# Patient Record
Sex: Male | Born: 1970 | Race: White | Hispanic: No | Marital: Married | Smoking: Former smoker
Health system: Southern US, Community
[De-identification: ages and names within clinical notes are randomized; demographics above are authoritative.]

## PROBLEM LIST (undated history)

## (undated) HISTORY — PX: SPINE SURGERY: SHX786

## (undated) HISTORY — PX: VASECTOMY: SHX75

## (undated) HISTORY — PX: KNEE SURGERY: SHX244

---

## 2000-09-09 ENCOUNTER — Other Ambulatory Visit: Admission: RE | Admit: 2000-09-09 | Discharge: 2000-09-09 | Payer: Self-pay | Admitting: Orthopedic Surgery

## 2003-10-19 ENCOUNTER — Encounter: Admission: RE | Admit: 2003-10-19 | Discharge: 2004-01-17 | Payer: Self-pay | Admitting: Nurse Practitioner

## 2004-05-15 ENCOUNTER — Ambulatory Visit: Payer: Self-pay | Admitting: Family Medicine

## 2004-06-04 HISTORY — PX: VASECTOMY: SHX75

## 2005-04-27 ENCOUNTER — Ambulatory Visit: Payer: Self-pay | Admitting: Family Medicine

## 2006-09-23 ENCOUNTER — Encounter: Payer: Self-pay | Admitting: Family Medicine

## 2006-09-23 ENCOUNTER — Ambulatory Visit: Payer: Self-pay | Admitting: Family Medicine

## 2008-06-04 HISTORY — PX: SPINE SURGERY: SHX786

## 2010-07-05 ENCOUNTER — Encounter: Payer: Self-pay | Admitting: Family Medicine

## 2010-07-05 ENCOUNTER — Ambulatory Visit: Admit: 2010-07-05 | Payer: Self-pay | Admitting: Family Medicine

## 2010-07-05 ENCOUNTER — Ambulatory Visit (INDEPENDENT_AMBULATORY_CARE_PROVIDER_SITE_OTHER): Payer: 59 | Admitting: Family Medicine

## 2010-07-05 ENCOUNTER — Ambulatory Visit: Payer: Self-pay | Admitting: Family Medicine

## 2010-07-05 DIAGNOSIS — N529 Male erectile dysfunction, unspecified: Secondary | ICD-10-CM | POA: Insufficient documentation

## 2010-07-05 DIAGNOSIS — M549 Dorsalgia, unspecified: Secondary | ICD-10-CM | POA: Insufficient documentation

## 2010-07-05 DIAGNOSIS — Z0000 Encounter for general adult medical examination without abnormal findings: Secondary | ICD-10-CM

## 2010-07-05 DIAGNOSIS — Z23 Encounter for immunization: Secondary | ICD-10-CM

## 2010-07-05 DIAGNOSIS — M25569 Pain in unspecified knee: Secondary | ICD-10-CM | POA: Insufficient documentation

## 2010-07-05 DIAGNOSIS — M5412 Radiculopathy, cervical region: Secondary | ICD-10-CM | POA: Insufficient documentation

## 2010-07-12 NOTE — Assessment & Plan Note (Addendum)
Summary: new pt to est/jrt   Vital Signs:  Patient profile:   40 year old male Height:      71 inches Weight:      196.50 pounds BMI:     27.51 Temp:     98.3 degrees F oral Pulse rate:   84 / minute Pulse rhythm:   regular BP sitting:   116 / 82  (left arm) Cuff size:   regular  Vitals Entered By: Delilah Shan CMA Rumaysa Sabatino Dull) (July 05, 2010 3:15 PM) CC: New Patient to Establish   History of Present Illness: New patient- here to reest care.   CPE- see plan.   Knee pain- R knee with pain. Prev seen by Dr. August Saucer and had arthroscopy done before.  H/o ganglion cyst removal from the knee.  He has limited range of motion for knee flexion.  Crepitus noted by patient.   R lower back pain- prev ortho eval.  No recent changes.  Continues to have episodic pain.   h/o pars defect per patient.   R arm symptoms.  R thumb has a decrease in sensaiton.  If he abducts is arm above his head, he gets pain, numbness in the arm.  He feels a "pulling" in the neck on the R side with head tilt to the left.  Going on for a few weeks.  Can do push ups with neck extended but R arm will go numb in dermatomal distribtion if he does push ups with spine in neutral position.   ED- Some ED with decrease in AM erections.  Gradual change per patient. Nonsmoker. He has some dribbling with stopping urine stream.  No problems starting urination.   Allergies (verified): No Known Drug Allergies  Past History:  Past Medical History: h/o varicella FAMILY HISTORY OF DIABETES MELLITUS (ICD-V18.0) PHYSICAL EXAMINATION (ICD-V70.0) ORGANIC IMPOTENCE (ICD-607.84) CERVICAL RADICULOPATHY, RIGHT (ICD-723.4) BACK PAIN (ICD-724.5), h/o pars defect per ortho KNEE PAIN, RIGHT (EAV-409.81)    Past Surgical History: Tonsillectomy as child Vasectomy  Family History: Reviewed history from 09/23/2006 and no changes required. Father A, arthritis Mother A arthritis Brother A healthy  Social History: Reviewed history from  09/23/2006 and no changes required. Occupation:Machine operator [employer] Married 1996, 2 Daughters Former Smoker Quit 1997 20pyh Alcohol use-yes, occ Drug use-no exercise: decreased after knee pain increase, still walking some  Review of Systems       See HPI.  Otherwise negative.    Physical Exam  General:  no apparent distress normocephalic atraumatic mucous membranes moist neck supple but spurling's positive for T1 dermatome on the R no midline pain, but spinous process of T1 is deviated to R by a few mm.  this isn't tender to palpation regular rate and rhythm clear to auscultation bilaterally abdomen soft, not tender to palpation ext well perfused.  R knee with scar noted but not puffy.  ACL/MCL/LCL intact on testing but patellar crepitus noted. SLR neg.  DTRs wnl no weakness in the arms, hands.  he does have mild increase in R thumb numbness with persistent wrist flexion.   Genitalia:  Testes bilaterally descended without nodularity, tenderness or masses. No scrotal masses or lesions. No penis lesions or urethral discharge. Prostate:  Prostate gland firm and smooth, no enlargement, nodularity, tenderness, mass, asymmetry or induration.   Impression & Recommendations:  Problem # 1:  Preventive Health Care (ICD-V70.0) Tdap today.  Flu shot encouraged.  No indication for PSA, colon CA screening.  Hope to  get leg and back pain controlled so he can start back with exercise routine.   Problem # 2:  ORGANIC IMPOTENCE (ICD-607.84) Check testosterone level.  If low, will refer to uro for consideration of tx.  If not low, then can consider viagra or other.  See notes on labs.  Return for AM sample.  d/w patient and he understood.   Problem # 3:  CERVICAL RADICULOPATHY, RIGHT (ICD-723.4) with T1 dermatome distribution along with L thumb numbness.  He may have radicular source and carpal tunnel.  I offered medical tx, physical therapy, neurosurg referral. He wanted to  try chiropracter and I think this is reasonable for now as he has no weakness.  If not improved, he'll call back with update and we can refer.    Problem # 4:  KNEE PAIN, RIGHT (ICD-719.46) He'll follow up with ortho.   Problem # 5:  BACK PAIN (ICD-724.5) He'll follow up with ortho.   Problem # 6:  FAMILY HISTORY OF DIABETES MELLITUS (ICD-V18.0) Return for fasting labs.   Complete Medication List: 1)  Multivitamins Tabs (Multiple vitamin) .... Take 1 tablet by mouth once a day  Other Orders: Tdap => 26yrs IM (04540) Admin 1st Vaccine (98119)  Patient Instructions: 1)  Come back for fasting labs one morning.  cmet/lipid v18.0, testosterone 607.84.  2)  I would call Dr. August Saucer about your knee and lower back pain.  3)  If you take the aleve at night for your arm, make sure to eat before taking the medicine. 4)  I would either start physical therapy or go to a chiropracter about your arm symptoms.  If you aren't getting better, let me know so we can talk about options (like neurosurgery referral).  If have weakness or progressive symptoms, you need to let me know.  5)  I would get a flu shot.  6)  Take care.   Orders Added: 1)  New Patient 40-64 years [99386] 2)  New Patient Level II [99202] 3)  Tdap => 33yrs IM [90715] 4)  Admin 1st Vaccine [90471]   Immunizations Administered:  Tetanus Vaccine:    Vaccine Type: Tdap    Site: left deltoid    Mfr: GlaxoSmithKline    Dose: 0.5 ml    Route: IM    Given by: Delilah Shan CMA (AAMA)    Exp. Date: 03/23/2012    Lot #: [id number]    VIS given: 04/21/08 version given July 05, 2010.   Immunizations Administered:  Tetanus Vaccine:    Vaccine Type: Tdap    Site: left deltoid    Mfr: GlaxoSmithKline    Dose: 0.5 ml    Route: IM    Given by: Delilah Shan CMA (AAMA)    Exp. Date: 03/23/2012    Lot #: [id number]    VIS given: 04/21/08 version given July 05, 2010.  Current Allergies (reviewed today): No known  allergies    Appended Document: new pt to est/jrt CPE- healthy habits encouraged.

## 2010-07-17 ENCOUNTER — Encounter: Payer: Self-pay | Admitting: Family Medicine

## 2010-07-27 ENCOUNTER — Other Ambulatory Visit: Payer: Self-pay | Admitting: Family Medicine

## 2010-07-27 ENCOUNTER — Encounter (INDEPENDENT_AMBULATORY_CARE_PROVIDER_SITE_OTHER): Payer: Self-pay | Admitting: *Deleted

## 2010-07-27 ENCOUNTER — Other Ambulatory Visit (INDEPENDENT_AMBULATORY_CARE_PROVIDER_SITE_OTHER): Payer: 59

## 2010-07-27 DIAGNOSIS — N529 Male erectile dysfunction, unspecified: Secondary | ICD-10-CM

## 2010-07-27 DIAGNOSIS — Z833 Family history of diabetes mellitus: Secondary | ICD-10-CM

## 2010-07-27 LAB — BASIC METABOLIC PANEL
BUN: 20 mg/dL (ref 6–23)
Chloride: 109 mEq/L (ref 96–112)
Creatinine, Ser: 0.9 mg/dL (ref 0.4–1.5)
Glucose, Bld: 83 mg/dL (ref 70–99)
Potassium: 5.2 mEq/L — ABNORMAL HIGH (ref 3.5–5.1)

## 2010-07-27 LAB — LIPID PANEL
Cholesterol: 164 mg/dL (ref 0–200)
LDL Cholesterol: 106 mg/dL — ABNORMAL HIGH (ref 0–99)
Triglycerides: 54 mg/dL (ref 0.0–149.0)

## 2010-07-27 LAB — HEPATIC FUNCTION PANEL
ALT: 33 U/L (ref 0–53)
Albumin: 4 g/dL (ref 3.5–5.2)
Total Bilirubin: 0.8 mg/dL (ref 0.3–1.2)
Total Protein: 6.3 g/dL (ref 6.0–8.3)

## 2010-07-31 ENCOUNTER — Encounter (INDEPENDENT_AMBULATORY_CARE_PROVIDER_SITE_OTHER): Payer: Self-pay | Admitting: *Deleted

## 2010-07-31 ENCOUNTER — Other Ambulatory Visit: Payer: Self-pay | Admitting: Family Medicine

## 2010-07-31 ENCOUNTER — Other Ambulatory Visit (INDEPENDENT_AMBULATORY_CARE_PROVIDER_SITE_OTHER): Payer: 59

## 2010-07-31 DIAGNOSIS — E875 Hyperkalemia: Secondary | ICD-10-CM

## 2010-08-10 ENCOUNTER — Encounter: Payer: Self-pay | Admitting: Family Medicine

## 2010-08-10 NOTE — Letter (Signed)
Summary: [facility]  [facility]   Imported By: Kassie Mends 08/02/2010 08:20:25  _____________________________________________________________________  External Attachment:    Type:   Image     Comment:   External Document  Appended Document: [facility]    Clinical Lists Changes  Observations: Added new observation of PAST MED HX: h/o varicella FAMILY HISTORY OF DIABETES MELLITUS (ICD-V18.0) PHYSICAL EXAMINATION (ICD-V70.0) ORGANIC IMPOTENCE (ICD-607.84) CERVICAL RADICULOPATHY, RIGHT (ICD-723.4) per Dr. Danielle Dess 2012 BACK PAIN (ICD-724.5), h/o pars defect per ortho KNEE PAIN, RIGHT (ZOX-096.04)   (08/02/2010 21:22)       Past History:  Past Medical History: h/o varicella FAMILY HISTORY OF DIABETES MELLITUS (ICD-V18.0) PHYSICAL EXAMINATION (ICD-V70.0) ORGANIC IMPOTENCE (ICD-607.84) CERVICAL RADICULOPATHY, RIGHT (ICD-723.4) per Dr. Danielle Dess 2012 BACK PAIN (ICD-724.5), h/o pars defect per ortho KNEE PAIN, RIGHT (ICD-719.46)

## 2010-08-28 ENCOUNTER — Encounter: Payer: Self-pay | Admitting: Family Medicine

## 2010-08-31 NOTE — Letter (Signed)
Summary: [facility]  [facility]   Imported By: Maryln Gottron 08/18/2010 14:41:51  _____________________________________________________________________  External Attachment:    Type:   Image     Comment:   External Document  Appended Document: [facility]    Clinical Lists Changes  Allergies: Added new allergy or adverse reaction of * MOBIC Observations: Added new observation of ALLERGY REV: Done (08/20/2010 11:37) Added new observation of NKA: F (08/20/2010 11:37)        Allergies (verified): 1)  ! * Mobic

## 2010-09-24 ENCOUNTER — Encounter: Payer: Self-pay | Admitting: Family Medicine

## 2012-12-23 ENCOUNTER — Ambulatory Visit (INDEPENDENT_AMBULATORY_CARE_PROVIDER_SITE_OTHER): Payer: BC Managed Care – PPO | Admitting: Family Medicine

## 2012-12-23 ENCOUNTER — Encounter: Payer: Self-pay | Admitting: Family Medicine

## 2012-12-23 VITALS — BP 120/72 | HR 75 | Temp 97.7°F | Ht 71.0 in | Wt 201.0 lb

## 2012-12-23 DIAGNOSIS — R079 Chest pain, unspecified: Secondary | ICD-10-CM | POA: Insufficient documentation

## 2012-12-23 DIAGNOSIS — M654 Radial styloid tenosynovitis [de Quervain]: Secondary | ICD-10-CM

## 2012-12-23 MED ORDER — DICLOFENAC SODIUM 75 MG PO TBEC: 75.0000 mg | DELAYED_RELEASE_TABLET | Freq: Two times a day (BID) | ORAL | Status: DC

## 2012-12-23 NOTE — Assessment & Plan Note (Signed)
Treat with NSAIDs, stretches, info given.  Wear thumb spica splint daily.

## 2012-12-23 NOTE — Progress Notes (Signed)
  Subjective:    Patient ID: Timothy Zavala, male    DOB: [birth year], 42 y.o.   MRN: [id number]  HPI  42 year old male pt of Dr. Lianne Bushy with history of right cervical radiculopathy presents with  24 hours of pain with left wrist pain pulling thumb up, opening hand. Has pain in mid lower arm. Pain was worsening with work yesterday moving. He does do a lot of repetitive twisting. No typing. No swelling, no redness, no heat.  No numbness or tingling in left hand.   He had artificial disk in neck for degenerative changes in cervical spine placed2012  No change in chronic neck pain.. Just tightness in neck.   In last few months he has had some minutes of left chest wall twinges of pain during exercise, goes away with rest.  He feels SOB from being out of shape all the time, no sweats. Has been stressed out lately.  No family history of CAD.  remote smoker...8 pack year history.    Review of Systems  Constitutional: Negative for fever and fatigue.  HENT: Negative for ear discharge.   Eyes: Negative for pain.  Respiratory: Negative for cough, shortness of breath and wheezing.   Cardiovascular: Positive for chest pain and palpitations. Negative for leg swelling.  Gastrointestinal: Negative for abdominal pain and abdominal distention.       Objective:   Physical Exam  Constitutional: Vital signs are normal. He appears well-developed and well-nourished.  Muscular male  HENT:  Head: Normocephalic.  Right Ear: Hearing normal.  Left Ear: Hearing normal.  Nose: Nose normal.  Mouth/Throat: Oropharynx is clear and moist and mucous membranes are normal.  Neck: Trachea normal. Carotid bruit is not present. No mass and no thyromegaly present.  Cardiovascular: Normal rate, regular rhythm and normal pulses.  Exam reveals no gallop, no distant heart sounds and no friction rub.   No murmur heard. No peripheral edema  Pulmonary/Chest: Effort normal and breath sounds normal. No  respiratory distress.  Musculoskeletal:       Left wrist: He exhibits decreased range of motion and tenderness. He exhibits no bony tenderness, no swelling, no effusion, no crepitus and no deformity.  Positive finkelstein test on left.  Ttp over thumb abductors. Nml grip strength Neg tinel, neg phalen's  Skin: Skin is warm, dry and intact. No rash noted.  Psychiatric: He has a normal mood and affect. His speech is normal and behavior is normal. Thought content normal.          Assessment & Plan:

## 2012-12-23 NOTE — Assessment & Plan Note (Addendum)
EKG showed NSR. If pain continuing.. Follow up with Dr. Para March for further eval such as cardiolyte, but pt is low risk for CAD.

## 2012-12-23 NOTE — Patient Instructions (Addendum)
Start home stretching exercises.  Avoid twisting repetitively.   Wear brace on left wrist.  Start diclofenac 75 mg  twice daily  as needed for pain and inflammation.  Call if not improving in 2 weeks.

## 2017-03-14 ENCOUNTER — Ambulatory Visit: Payer: Self-pay | Admitting: Family Medicine

## 2017-03-14 ENCOUNTER — Encounter: Payer: Self-pay | Admitting: Family Medicine

## 2017-03-14 ENCOUNTER — Ambulatory Visit (INDEPENDENT_AMBULATORY_CARE_PROVIDER_SITE_OTHER): Payer: BLUE CROSS/BLUE SHIELD | Admitting: Family Medicine

## 2017-03-14 VITALS — BP 110/80 | HR 64 | Temp 98.0°F | Ht 71.25 in | Wt 220.4 lb

## 2017-03-14 DIAGNOSIS — F1010 Alcohol abuse, uncomplicated: Secondary | ICD-10-CM

## 2017-03-14 DIAGNOSIS — G8929 Other chronic pain: Secondary | ICD-10-CM | POA: Diagnosis not present

## 2017-03-14 DIAGNOSIS — M549 Dorsalgia, unspecified: Secondary | ICD-10-CM | POA: Diagnosis not present

## 2017-03-14 DIAGNOSIS — K429 Umbilical hernia without obstruction or gangrene: Secondary | ICD-10-CM | POA: Diagnosis not present

## 2017-03-14 DIAGNOSIS — M25569 Pain in unspecified knee: Secondary | ICD-10-CM

## 2017-03-14 NOTE — Patient Instructions (Signed)
BEFORE YOU LEAVE: -assist him in scheduling OMT if he wishes -follow up: yearly for physical  -We placed a referral for you as discussed to surgery about the hernia. It usually takes about 1-2 weeks to process and schedule this referral. If you have not heard from [place] regarding this appointment in 2 weeks please contact our office.   We recommend the following healthy lifestyle for LIFE: 1) Small portions. Regular meals.  2) Eat a healthy clean diet.   TRY TO EAT: -at least 5-7 servings of low sugar vegetables per day (not corn, potatoes or bananas.) -berries are the best choice if you wish to eat fruit.   -lean meets (fish, chicken or [place] breasts) -vegan proteins for some meals - beans or tofu, whole grains, nuts and seeds -Replace bad fats with good fats - good fats include: fish, nuts and seeds, canola oil, olive oil -small amounts of low fat or non fat dairy -small amounts of100 % whole grains - check the lables  AVOID: -SUGAR, sweets, anything with added sugar, corn syrup or sweeteners -if you must have a sweetener, small amounts of stevia may be best -sweetened beverages -simple starches (rice, bread, potatoes, pasta, chips, etc - small amounts of 100% whole grains are ok) -red meat, pork, butter -fried foods, fast food, processed food, excessive dairy, eggs and coconut.  3)Get at least 150 minutes of sweaty aerobic exercise per week.  4)Reduce stress - consider counseling, meditation and relaxation to balance other aspects of your life.

## 2017-03-14 NOTE — Progress Notes (Signed)
HPI:  Timothy Zavala is here to establish care. His main concern today is an umbilical hernia. He likes to do a lot of heavy lifting and stay active and this is interfering with his ability to do so. He has noticed for some time a bulge around the umbilicus. Sometimes it is uncomfortable. It is reducible. He has past medical history of chronic knee pain and back pain. He has had disc replacement with Dr. Danielle Dess. He also has had arthroscopy of the knee with orthopedics in the past. He is interested in osteopathic treatments. He drinks a significant amount of alcohol. About a 6 pack per day. He does not feel that he would have any difficulty cutting back.  ROS negative for unless reported above: fevers, unintentional weight loss, hearing or vision loss, chest pain, palpitations, struggling to breath, hemoptysis, melena, hematochezia, hematuria, falls, loc, si, thoughts of self harm  No past medical history on file.  Past Surgical History:  Procedure Laterality Date  . SPINE SURGERY    . VASECTOMY      Family History  Problem Relation Age of Onset  . Arthritis Mother   . Hypotension Mother   . Arthritis Father   . Stomach cancer Paternal Uncle   . Atrial fibrillation Paternal Uncle     Social History   Social History  . Marital status: Married    Spouse name: N/A  . Number of children: N/A  . Years of education: N/A   Social History Main Topics  . Smoking status: Former Games developer  . Smokeless tobacco: Never Used  . Alcohol use Yes  . Drug use: No  . Sexual activity: Not Asked   Other Topics Concern  . None   Social History Narrative   Work or Chief Strategy Officer      Home Situation:      Spiritual Beliefs:      Lifestyle:       No current outpatient prescriptions on file.  EXAM:  Vitals:   03/14/17 1426  BP: 110/80  Pulse: 64  Temp: 98 F (36.7 C)  SpO2: 97%    Body mass index is 30.52 kg/m.  GENERAL: vitals reviewed and listed above, alert,  oriented, appears well hydrated and in no acute distress  HEENT: atraumatic, conjunttiva clear, no obvious abnormalities on inspection of external nose and ears  NECK: no obvious masses on inspection  LUNGS: clear to auscultation bilaterally, no wheezes, rales or rhonchi, good air movement  CV: HRRR, no peripheral edema  ABD: small reducible umbilical hernia  MS: moves all extremities without noticeable abnormality  PSYCH: pleasant and cooperative, no obvious depression or anxiety  ASSESSMENT AND PLAN:  Discussed the following assessment and plan:  Umbilical hernia without obstruction and without gangrene - Plan: Ambulatory referral to General Surgery  Chronic back pain, unspecified back location, unspecified back pain laterality  Chronic knee pain, unspecified laterality  Alcohol abuse -We reviewed the PMH, PSH, FH, SH, Meds and Allergies. -advised to cut back on alcohol use to no more then 3 drinks in a day and no more then 14 in a week tops -advised and healthy diet and regular gentle aerobic exercise -referral to surgery to discuss repair options for small umbilical hernia, risks and emergency precautions discussed -he wants to consider OMT and may schedule -advised lipids, hgba1c and LFTs - he reports does at work yearly and will bring copy, reports all was good -We have advised patient to follow up per instructions below.   -  Patient advised to return or notify a doctor immediately if symptoms worsen or persist or new concerns arise.  Patient Instructions  BEFORE YOU LEAVE: -assist him in scheduling OMT if he wishes -follow up: yearly for physical  -We placed a referral for you as discussed to surgery about the hernia. It usually takes about 1-2 weeks to process and schedule this referral. If you have not heard from [place] regarding this appointment in 2 weeks please contact our office.   We recommend the following healthy lifestyle for LIFE: 1) Small portions. Regular  meals.  2) Eat a healthy clean diet.   TRY TO EAT: -at least 5-7 servings of low sugar vegetables per day (not corn, potatoes or bananas.) -berries are the best choice if you wish to eat fruit.   -lean meets (fish, chicken or [place] breasts) -vegan proteins for some meals - beans or tofu, whole grains, nuts and seeds -Replace bad fats with good fats - good fats include: fish, nuts and seeds, canola oil, olive oil -small amounts of low fat or non fat dairy -small amounts of100 % whole grains - check the lables  AVOID: -SUGAR, sweets, anything with added sugar, corn syrup or sweeteners -if you must have a sweetener, small amounts of stevia may be best -sweetened beverages -simple starches (rice, bread, potatoes, pasta, chips, etc - small amounts of 100% whole grains are ok) -red meat, pork, butter -fried foods, fast food, processed food, excessive dairy, eggs and coconut.  3)Get at least 150 minutes of sweaty aerobic exercise per week.  4)Reduce stress - consider counseling, meditation and relaxation to balance other aspects of your life.     Kriste Basque R.

## 2017-03-19 ENCOUNTER — Ambulatory Visit: Payer: Self-pay | Admitting: General Surgery

## 2017-03-19 DIAGNOSIS — K420 Umbilical hernia with obstruction, without gangrene: Secondary | ICD-10-CM | POA: Diagnosis not present

## 2017-03-26 ENCOUNTER — Encounter: Payer: Self-pay | Admitting: Family Medicine

## 2017-03-26 ENCOUNTER — Ambulatory Visit (INDEPENDENT_AMBULATORY_CARE_PROVIDER_SITE_OTHER): Payer: BLUE CROSS/BLUE SHIELD | Admitting: Family Medicine

## 2017-03-26 VITALS — BP 102/80 | HR 66 | Temp 98.0°F | Ht 71.25 in | Wt 218.6 lb

## 2017-03-26 DIAGNOSIS — M9902 Segmental and somatic dysfunction of thoracic region: Secondary | ICD-10-CM | POA: Diagnosis not present

## 2017-03-26 DIAGNOSIS — M9904 Segmental and somatic dysfunction of sacral region: Secondary | ICD-10-CM | POA: Diagnosis not present

## 2017-03-26 DIAGNOSIS — M9903 Segmental and somatic dysfunction of lumbar region: Secondary | ICD-10-CM

## 2017-03-26 DIAGNOSIS — G8929 Other chronic pain: Secondary | ICD-10-CM

## 2017-03-26 DIAGNOSIS — K429 Umbilical hernia without obstruction or gangrene: Secondary | ICD-10-CM

## 2017-03-26 DIAGNOSIS — M545 Low back pain, unspecified: Secondary | ICD-10-CM

## 2017-03-26 DIAGNOSIS — M9901 Segmental and somatic dysfunction of cervical region: Secondary | ICD-10-CM

## 2017-03-26 DIAGNOSIS — M542 Cervicalgia: Secondary | ICD-10-CM | POA: Diagnosis not present
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
## 2017-03-26 NOTE — Patient Instructions (Addendum)
BEFORE YOU LEAVE: -SI joint exercises -follow up: 1-2 weeks if needed  Also check out [employer]"Foundation Training" which is a program developed by Dr. Myles LippsEric Goodman. Do not do any of the moves that aggravate or cause your hernia to "pop" out.   A good intro video is: "Independence from Pain 7-minute Video" - [email or web address]/[email or web address]?[email or web address]

## 2017-08-01 ENCOUNTER — Encounter: Payer: BLUE CROSS/BLUE SHIELD | Admitting: Family Medicine

## 2017-08-15 ENCOUNTER — Ambulatory Visit (INDEPENDENT_AMBULATORY_CARE_PROVIDER_SITE_OTHER): Payer: BLUE CROSS/BLUE SHIELD | Admitting: Family Medicine

## 2017-08-15 ENCOUNTER — Encounter: Payer: Self-pay | Admitting: Family Medicine

## 2017-08-15 VITALS — BP 112/78 | HR 98 | Temp 97.6°F | Ht 71.25 in | Wt 215.9 lb

## 2017-08-15 DIAGNOSIS — Z0000 Encounter for general adult medical examination without abnormal findings: Secondary | ICD-10-CM | POA: Diagnosis not present

## 2017-08-15 DIAGNOSIS — Z1331 Encounter for screening for depression: Secondary | ICD-10-CM | POA: Diagnosis not present

## 2017-08-15 NOTE — Patient Instructions (Signed)
BEFORE YOU LEAVE: -labs -follow up: yearly for physical and as needed  We have ordered labs or studies at this visit. It can take up to 1-2 weeks for results and processing. IF results require follow up or explanation, we will call you with instructions. Clinically stable results will be released to your [facility]. If you have not heard from [place] or cannot find your results in [facility] in 2 weeks please contact our office at [phone].  If you are not yet signed up for [facility], please consider signing up.        Preventive Care 40-64 Years, Male Preventive care refers to lifestyle choices and visits with your health care provider that can promote health and wellness. What does preventive care include?  A yearly physical exam. This is also called an annual well check.  Dental exams once or twice a year.  Routine eye exams. Ask your health care provider how often you should have your eyes checked.  Personal lifestyle choices, including: ? Daily care of your teeth and gums. ? Regular physical activity. ? Eating a healthy diet. ? Avoiding tobacco and drug use. ? Limiting alcohol use. ? Practicing safe sex. ? Taking low-dose aspirin every day starting at age 69. What happens during an annual well check? The services and screenings done by your health care provider during your annual well check will depend on your age, overall health, lifestyle risk factors, and family history of disease. Counseling Your health care provider may ask you questions about your:  Alcohol use.  Tobacco use.  Drug use.  Emotional well-being.  Home and relationship well-being.  Sexual activity.  Eating habits.  Work and work Statistician.  Screening You may have the following tests or measurements:  Height, weight, and BMI.  Blood pressure.  Lipid and cholesterol levels. These may be checked every 5 years, or more frequently if you are over 75 years old.  Skin check.  Lung cancer  screening. You may have this screening every year starting at age 5 if you have a 30-pack-year history of smoking and currently smoke or have quit within the past 15 years.  Fecal occult blood test (FOBT) of the stool. You may have this test every year starting at age 44.  Flexible sigmoidoscopy or colonoscopy. You may have a sigmoidoscopy every 5 years or a colonoscopy every 10 years starting at age 72.  Prostate cancer screening. Recommendations will vary depending on your family history and other risks.  Hepatitis C blood test.  Hepatitis B blood test.  Sexually transmitted disease (STD) testing.  Diabetes screening. This is done by checking your blood sugar (glucose) after you have not eaten for a while (fasting). You may have this done every 1-3 years.  Discuss your test results, treatment options, and if necessary, the need for more tests with your health care provider. Vaccines Your health care provider may recommend certain vaccines, such as:  Influenza vaccine. This is recommended every year.  Tetanus, diphtheria, and acellular pertussis (Tdap, Td) vaccine. You may need a Td booster every 10 years.  Varicella vaccine. You may need this if you have not been vaccinated.  Zoster vaccine. You may need this after age 28.  Measles, mumps, and rubella (MMR) vaccine. You may need at least one dose of MMR if you were born in 1957 or later. You may also need a second dose.  Pneumococcal 13-valent conjugate (PCV13) vaccine. You may need this if you have certain conditions and have not been vaccinated.  Pneumococcal polysaccharide (PPSV23) vaccine. You may need one or two doses if you smoke cigarettes or if you have certain conditions.  Meningococcal vaccine. You may need this if you have certain conditions.  Hepatitis A vaccine. You may need this if you have certain conditions or if you travel or work in places where you may be exposed to hepatitis A.  Hepatitis B vaccine. You  may need this if you have certain conditions or if you travel or work in places where you may be exposed to hepatitis B.  Haemophilus influenzae type b (Hib) vaccine. You may need this if you have certain risk factors.  Talk to your health care provider about which screenings and vaccines you need and how often you need them. This information is not intended to replace advice given to you by your health care provider. Make sure you discuss any questions you have with your health care provider. Document Released: 06/17/2015 Document Revised: 02/08/2016 Document Reviewed: 03/22/2015 Elsevier Interactive Patient Education  [employer].

## 2017-08-15 NOTE — Progress Notes (Signed)
HPI:  Using dictation device. Unfortunately this device frequently misinterprets words/phrases.  Here for CPE: PMH chronic neck pain, abd hernia.  Reports is been doing well.  Neck is been stable and.  He is never did see the surgeon yet about the hernia, but has not had any issues.  Occasionally feels a twinge of discomfort around the hernia with Valsalva, but otherwise has been  -Diet: variety of foods, balance and well rounded, larger portion sizes -Exercise: Trying to get more regular exercise -Diabetes and Dyslipidemia Screening: Fasting today for labs -Hx of HTN: no -Vaccines: UTD -sexual activity: yes, male partner, no new partners -wants STI testing, Hep C screening (if born 45-1967): no except for agrees to HIV screen -FH colon or prstate ca: see FH Last colon cancer screening: Not applicable  Last prostate ca screening: Not applicable -Alcohol, Tobacco, drug use: see social history  Review of Systems - no fevers, unintentional weight loss, vision loss, hearing loss, chest pain, sob, hemoptysis, melena, hematochezia, hematuria, genital discharge, changing or concerning skin lesions, bleeding, bruising, loc, thoughts of self harm or SI  Denies drug or tobacco use.  Drinking about 2-3 beers per week.  History reviewed. No pertinent past medical history.  Past Surgical History:  Procedure Laterality Date  . SPINE SURGERY    . VASECTOMY      Family History  Problem Relation Age of Onset  . Arthritis Mother   . Hypotension Mother   . Arthritis Father   . Stomach cancer Paternal Uncle   . Atrial fibrillation Paternal Uncle     Social History   Socioeconomic History  . Marital status: Married    Spouse name: None  . Number of children: None  . Years of education: None  . Highest education level: None  Social Needs  . Financial resource strain: None  . Food insecurity - worry: None  . Food insecurity - inability: None  . Transportation needs - medical: None    . Transportation needs - non-medical: None  Occupational History  . None  Tobacco Use  . Smoking status: Former Research scientist (life sciences)  . Smokeless tobacco: Never Used  Substance and Sexual Activity  . Alcohol use: Yes  . Drug use: No  . Sexual activity: None  Other Topics Concern  . None  Social History Narrative   Work or Armed forces technical officer      Home Situation:      Spiritual Beliefs:      Lifestyle:    No current outpatient medications on file.  EXAM:  Vitals:   08/15/17 1446  BP: 112/78  Pulse: 98  Temp: 97.6 F (36.4 C)  TempSrc: Oral  Weight: 215 lb 14.4 oz (97.9 kg)  Height: 5' 11.25" (1.81 m)    Estimated body mass index is 29.9 kg/m as calculated from the following:   Height as of this encounter: 5' 11.25" (1.81 m).   Weight as of this encounter: 215 lb 14.4 oz (97.9 kg).  GENERAL: vitals reviewed and listed below, alert, oriented, appears well hydrated and in no acute distress  HEENT: head atraumatic, PERRLA, normal appearance of eyes, ears, nose and mouth. moist mucus membranes.  NECK: supple, no masses or lymphadenopathy  LUNGS: clear to auscultation bilaterally, no rales, rhonchi or wheeze  CV: HRRR, no peripheral edema or cyanosis, normal pedal pulses  ABDOMEN: bowel sounds normal, soft, non tender to palpation, no masses, no rebound or guarding  GU/DRE declined:   SKIN: no rash or abnormal lesions -declined full skin  check, no abnormal lesions on exposed portions of skin  MS: normal gait, moves all extremities normally  NEURO: normal gait, speech and thought processing grossly intact, muscle tone grossly intact throughout  PSYCH: normal affect, pleasant and cooperative  ASSESSMENT AND PLAN:  Discussed the following assessment and plan:  PREVENTIVE EXAM: -Discussed and advised all [place] preventive services health task force level A and B recommendations for age, sex and risks. -Advised at least 150 minutes of exercise per week and a healthy  diet -labs, studies and vaccines per orders this encounter   Patient Instructions  BEFORE YOU LEAVE: -labs -follow up: yearly for physical and as needed  We have ordered labs or studies at this visit. It can take up to 1-2 weeks for results and processing. IF results require follow up or explanation, we will call you with instructions. Clinically stable results will be released to your [facility]. If you have not heard from [place] or cannot find your results in [facility] in 2 weeks please contact our office at [phone].  If you are not yet signed up for [facility], please consider signing up.        Preventive Care 40-64 Years, Male Preventive care refers to lifestyle choices and visits with your health care provider that can promote health and wellness. What does preventive care include?  A yearly physical exam. This is also called an annual well check.  Dental exams once or twice a year.  Routine eye exams. Ask your health care provider how often you should have your eyes checked.  Personal lifestyle choices, including: ? Daily care of your teeth and gums. ? Regular physical activity. ? Eating a healthy diet. ? Avoiding tobacco and drug use. ? Limiting alcohol use. ? Practicing safe sex. ? Taking low-dose aspirin every day starting at age 31. What happens during an annual well check? The services and screenings done by your health care provider during your annual well check will depend on your age, overall health, lifestyle risk factors, and family history of disease. Counseling Your health care provider may ask you questions about your:  Alcohol use.  Tobacco use.  Drug use.  Emotional well-being.  Home and relationship well-being.  Sexual activity.  Eating habits.  Work and work Statistician.  Screening You may have the following tests or measurements:  Height, weight, and BMI.  Blood pressure.  Lipid and cholesterol levels. These may be checked every 5  years, or more frequently if you are over 70 years old.  Skin check.  Lung cancer screening. You may have this screening every year starting at age 65 if you have a 30-pack-year history of smoking and currently smoke or have quit within the past 15 years.  Fecal occult blood test (FOBT) of the stool. You may have this test every year starting at age 2.  Flexible sigmoidoscopy or colonoscopy. You may have a sigmoidoscopy every 5 years or a colonoscopy every 10 years starting at age 97.  Prostate cancer screening. Recommendations will vary depending on your family history and other risks.  Hepatitis C blood test.  Hepatitis B blood test.  Sexually transmitted disease (STD) testing.  Diabetes screening. This is done by checking your blood sugar (glucose) after you have not eaten for a while (fasting). You may have this done every 1-3 years.  Discuss your test results, treatment options, and if necessary, the need for more tests with your health care provider. Vaccines Your health care provider may recommend certain vaccines, such as:  Influenza vaccine. This is recommended every year.  Tetanus, diphtheria, and acellular pertussis (Tdap, Td) vaccine. You may need a Td booster every 10 years.  Varicella vaccine. You may need this if you have not been vaccinated.  Zoster vaccine. You may need this after age 22.  Measles, mumps, and rubella (MMR) vaccine. You may need at least one dose of MMR if you were born in 1957 or later. You may also need a second dose.  Pneumococcal 13-valent conjugate (PCV13) vaccine. You may need this if you have certain conditions and have not been vaccinated.  Pneumococcal polysaccharide (PPSV23) vaccine. You may need one or two doses if you smoke cigarettes or if you have certain conditions.  Meningococcal vaccine. You may need this if you have certain conditions.  Hepatitis A vaccine. You may need this if you have certain conditions or if you travel or  work in places where you may be exposed to hepatitis A.  Hepatitis B vaccine. You may need this if you have certain conditions or if you travel or work in places where you may be exposed to hepatitis B.  Haemophilus influenzae type b (Hib) vaccine. You may need this if you have certain risk factors.  Talk to your health care provider about which screenings and vaccines you need and how often you need them. This information is not intended to replace advice given to you by your health care provider. Make sure you discuss any questions you have with your health care provider. Document Released: 06/17/2015 Document Revised: 02/08/2016 Document Reviewed: 03/22/2015 Elsevier Interactive Patient Education  2018 [employer].            No Follow-up on file.   Lucretia Kern, DO

## 2017-08-16 LAB — LIPID PANEL
CHOLESTEROL: 177 mg/dL (ref 0–200)
HDL: 52.2 mg/dL (ref >39.00)
LDL Cholesterol: 116 mg/dL — ABNORMAL HIGH (ref 0–99)
NONHDL: 125.24
Total CHOL/HDL Ratio: 3
Triglycerides: 46 mg/dL (ref 0.0–149.0)
VLDL: 9.2 mg/dL (ref 0.0–40.0)

## 2017-08-16 LAB — HEMOGLOBIN A1C: HEMOGLOBIN A1C: 6 % (ref 4.6–6.5)

## 2017-08-16 LAB — HIV ANTIBODY (ROUTINE TESTING W REFLEX): HIV: NONREACTIVE

## 2017-08-19 ENCOUNTER — Encounter: Payer: Self-pay | Admitting: Family Medicine

## 2018-06-19 ENCOUNTER — Ambulatory Visit: Payer: Self-pay

## 2018-06-19 NOTE — Telephone Encounter (Signed)
I called the pt and informed him of the message below.  Patient denies being around anyone that he knows of that had the flu.  I offered an appt and he stated he will wait to see what his wife recommends and call back if needed.

## 2018-06-19 NOTE — Telephone Encounter (Signed)
Incoming call fro wife of Patient whom states  Her husband has flu like Sx.  Temperatures of 102 101, coughing all night.  Reports sever body aches.  Dosent want to be touched.  Hurts all over He states. Non productive cough.  Onset was Tuesday.  Patient on quarantine to bedroom.  Patient has a humidifier .   Did not have flu shot. Drinking fluids. Patient and wife request that something be called in., PLEASE.      Reason for Disposition . [1] Patient is NOT HIGH RISK AND [2] strongly requests antiviral medicine AND [3] flu symptoms present < 48 hours  Answer Assessment - Initial Assessment Questions 1. WORST SYMPTOM: "What is your worst symptom?" (e.g., cough, runny nose, muscle aches, headache, sore throat, fever)     " hurts all over .  Hit all over.  2. ONSET: "When did your flu symptoms start?"      tuesday 3. COUGH: "How bad is the cough?"       severe 4. RESPIRATORY DISTRESS: "Describe your breathing."      denies 5. FEVER: "Do you have a fever?" If so, ask: "What is your temperature, how was it measured, and when did it start?"     101 this am  6. EXPOSURE: "Were you exposed to someone with influenza?"       unknown 7. FLU VACCINE: "Did you get a flu shot this year?"     denies 8. HIGH RISK DISEASE: "Do you any chronic medical problems?" (e.g., heart or lung disease, asthma, weak immune system, or other HIGH RISK conditions)     denies 9. PREGNANCY: "Is there any chance you are pregnant?" "When was your last menstrual period?"     na 10. OTHER SYMPTOMS: "Do you have any other symptoms?"  (e.g., runny nose, muscle aches, headache, sore throat)       Muscle aches,  Whole body ache  Protocols used: INFLUENZA - SEASONAL-A-AH

## 2018-06-19 NOTE — Telephone Encounter (Signed)
I would usually recommend appt to eval to ensure nothing else unless he has known flu exposure. If known flu exposure + symptoms and no SOB, inability to tol fluids, no severe symptoms ok to send tamiflu. O/w recs for eval here if appts or [facility].

## 2019-05-12 DIAGNOSIS — Z20828 Contact with and (suspected) exposure to other viral communicable diseases: Secondary | ICD-10-CM | POA: Diagnosis not present

## 2019-07-15 DIAGNOSIS — M542 Cervicalgia: Secondary | ICD-10-CM | POA: Diagnosis not present

## 2019-07-20 DIAGNOSIS — Z20828 Contact with and (suspected) exposure to other viral communicable diseases: Secondary | ICD-10-CM | POA: Diagnosis not present

## 2019-07-20 DIAGNOSIS — U071 COVID-19: Secondary | ICD-10-CM | POA: Diagnosis not present

## 2019-08-19 DIAGNOSIS — M542 Cervicalgia: Secondary | ICD-10-CM | POA: Diagnosis not present

## 2019-08-19 DIAGNOSIS — Z6831 Body mass index (BMI) 31.0-31.9, adult: Secondary | ICD-10-CM | POA: Diagnosis not present

## 2019-08-19 DIAGNOSIS — R030 Elevated blood-pressure reading, without diagnosis of hypertension: Secondary | ICD-10-CM | POA: Diagnosis not present

## 2019-11-03 DIAGNOSIS — I4891 Unspecified atrial fibrillation: Secondary | ICD-10-CM

## 2019-11-03 HISTORY — DX: Unspecified atrial fibrillation: I48.91

## 2019-12-03 DIAGNOSIS — L089 Local infection of the skin and subcutaneous tissue, unspecified: Secondary | ICD-10-CM | POA: Diagnosis not present

## 2019-12-06 DIAGNOSIS — M25522 Pain in left elbow: Secondary | ICD-10-CM | POA: Diagnosis not present

## 2019-12-06 DIAGNOSIS — R030 Elevated blood-pressure reading, without diagnosis of hypertension: Secondary | ICD-10-CM | POA: Diagnosis not present

## 2019-12-06 DIAGNOSIS — R238 Other skin changes: Secondary | ICD-10-CM | POA: Diagnosis not present

## 2019-12-06 DIAGNOSIS — L03114 Cellulitis of left upper limb: Secondary | ICD-10-CM | POA: Diagnosis not present

## 2019-12-07 DIAGNOSIS — R030 Elevated blood-pressure reading, without diagnosis of hypertension: Secondary | ICD-10-CM | POA: Diagnosis not present

## 2019-12-07 DIAGNOSIS — L03114 Cellulitis of left upper limb: Secondary | ICD-10-CM | POA: Diagnosis not present

## 2019-12-11 ENCOUNTER — Telehealth: Payer: Self-pay | Admitting: Family Medicine

## 2019-12-11 NOTE — Telephone Encounter (Signed)
11:45 on July 14th would work for a follow up and keep him closer to better timeline from discharge. Please have him let me know if any worsening of skin/redness prior to follow up appointment. If that time doesn't work let me know.

## 2019-12-11 NOTE — Telephone Encounter (Signed)
Pt wants to be established with [facility] but needs a hospital follow up within one week from discharge. Spoke to [place] and she stated to schedule him a hospital follow up and they will address everything else during the appt. Pt is scheduled for July 30th at 1:30pm however his ED papers wants him to follow up within one week of discharge (12/07/19). Sending message back to see if he is able to be seen earlier.   Pt can be reached at [phone]

## 2019-12-14 NOTE — Telephone Encounter (Signed)
Left a message for the pt to return my call.  

## 2019-12-15 NOTE — Telephone Encounter (Signed)
Left a message for the pt to return my call.  

## 2019-12-15 NOTE — Telephone Encounter (Signed)
Patient called back and was informed of the message below.  Patient agreed to arrive tomorrow at 11:30am and message was sent to [facility] to add the time slot.

## 2019-12-16 ENCOUNTER — Other Ambulatory Visit: Payer: Self-pay

## 2019-12-16 ENCOUNTER — Ambulatory Visit (INDEPENDENT_AMBULATORY_CARE_PROVIDER_SITE_OTHER): Payer: BC Managed Care – PPO | Admitting: Family Medicine

## 2019-12-16 ENCOUNTER — Encounter: Payer: Self-pay | Admitting: Family Medicine

## 2019-12-16 VITALS — BP 120/90 | HR 66 | Temp 98.1°F | Ht 71.25 in | Wt 219.4 lb

## 2019-12-16 DIAGNOSIS — M7022 Olecranon bursitis, left elbow: Secondary | ICD-10-CM

## 2019-12-16 NOTE — Patient Instructions (Signed)
Avoid any trauma/pressure to that left elbow. Let me know if any worsening swelling, pain, redness.   Send me a picture of labs if you find them so I can review.

## 2019-12-16 NOTE — Progress Notes (Signed)
Timothy Zavala DOB: December 13, 1970 Encounter date: 12/16/2019  This is a 49 y.o. male who presents with Chief Complaint  Patient presents with   Hospitalization Follow-up    History of present illness: Had swollen all the way down from left elbow most way down forearm. Hard to even move elbow a couple of weeks ago. Skin was glistening it was so swollen. Had IV clindamycin. Had xray which was ok. Took clindamycin for 5 more days. Still swollen some; still a little warm. Bone is still a little tender. Finished abx last Friday; hasn't gotten worse since stopping it. Has just stayed at this point. Some mornings swells up a little more. Hurts if he bumps it, but not just with moving.   States that a week prior to ER; had noted a little bump and was tender; swelling really started about 5 days after.   Not had issues with that left elbow before.   Does bloodwork through work on yearly basis.   Has cut out drinking during the week. Drinks more on the weekends.   Allergies  Allergen Reactions   Meloxicam     REACTION: intolerant, mood changes, does tolerate ibuprofen   No outpatient medications have been marked as taking for the 12/16/19 encounter (Office Visit) with Wynn Banker, MD.    Review of Systems  Constitutional: Negative for chills, fatigue and fever.  Respiratory: Negative for cough, chest tightness, shortness of breath and wheezing.   Cardiovascular: Negative for chest pain, palpitations and leg swelling.    Objective:  BP 120/90 (BP Location: Right Arm, Patient Position: Sitting, Cuff Size: Large)    Pulse 66    Temp 98.1 F (36.7 C) (Oral)    Ht 5' 11.25" (1.81 m)    Wt 219 lb 6.4 oz (99.5 kg)    SpO2 97%    BMI 30.39 kg/m   Weight: 219 lb 6.4 oz (99.5 kg)   BP Readings from Last 3 Encounters:  12/16/19 120/90  08/15/17 112/78  03/26/17 102/80   Wt Readings from Last 3 Encounters:  12/16/19 219 lb 6.4 oz (99.5 kg)  08/15/17 215 lb 14.4 oz (97.9 kg)   03/26/17 218 lb 9.6 oz (99.2 kg)    Physical Exam Constitutional:      General: He is not in acute distress.    Appearance: He is well-developed.  Cardiovascular:     Rate and Rhythm: Normal rate and regular rhythm.     Heart sounds: Normal heart sounds. No murmur heard.  No friction rub.  Pulmonary:     Effort: Pulmonary effort is normal. No respiratory distress.     Breath sounds: Normal breath sounds. No wheezing or rales.  Musculoskeletal:     Right lower leg: No edema.     Left lower leg: No edema.     Comments: No erythema or warmth of left elbow.  No pain with joint range of motion and no restriction in range of motion.  There is a small fluid collection of the olecranon bursa.  Neurological:     Mental Status: He is alert and oriented to person, place, and time.  Psychiatric:        Behavior: Behavior normal.     Assessment/Plan  1. Olecranon bursitis of left elbow Cellulitis is resolved.  Advised patient to avoid trauma to left elbow and even gentle pressure to help with resolution of bursitis.  We discussed that sometimes this can be drained, but I would avoid this at present due  to recent cellulitis.  We discussed that this can recur even after drainage.  Since he is not having any more pain in the elbow, I have advised him just to monitor and he does already have a follow-up appointment scheduled so we can recheck at that time.   Return for appointment scheduled. ER note reviewed, chart review, imaging/lab work reviewed from ER, discussion with patient about current condition, charting 30 minutes.   Theodis Shove, MD

## 2019-12-26 DIAGNOSIS — R002 Palpitations: Secondary | ICD-10-CM | POA: Diagnosis not present

## 2019-12-26 DIAGNOSIS — I4891 Unspecified atrial fibrillation: Secondary | ICD-10-CM | POA: Diagnosis not present

## 2019-12-28 DIAGNOSIS — R9431 Abnormal electrocardiogram [ECG] [EKG]: Secondary | ICD-10-CM | POA: Diagnosis not present

## 2019-12-28 DIAGNOSIS — I493 Ventricular premature depolarization: Secondary | ICD-10-CM | POA: Diagnosis not present

## 2019-12-28 DIAGNOSIS — I4891 Unspecified atrial fibrillation: Secondary | ICD-10-CM | POA: Diagnosis not present

## 2019-12-30 LAB — HEMOGLOBIN A1C: Hemoglobin A1C: 5.4

## 2019-12-30 LAB — LIPID PANEL
Cholesterol: 209 — AB (ref 0–200)
HDL: 60 (ref 35–70)
LDL Cholesterol: 134
Triglycerides: 61 (ref 40–160)

## 2019-12-30 LAB — BASIC METABOLIC PANEL: Glucose: 80

## 2020-01-01 ENCOUNTER — Encounter: Payer: Self-pay | Admitting: Family Medicine

## 2020-01-01 ENCOUNTER — Ambulatory Visit: Payer: BC Managed Care – PPO | Admitting: Family Medicine

## 2020-01-01 ENCOUNTER — Other Ambulatory Visit: Payer: Self-pay

## 2020-01-01 VITALS — BP 102/80 | HR 78 | Temp 98.0°F | Ht 71.25 in | Wt 221.2 lb

## 2020-01-01 DIAGNOSIS — I480 Paroxysmal atrial fibrillation: Secondary | ICD-10-CM

## 2020-01-01 DIAGNOSIS — M7022 Olecranon bursitis, left elbow: Secondary | ICD-10-CM

## 2020-01-01 DIAGNOSIS — K429 Umbilical hernia without obstruction or gangrene: Secondary | ICD-10-CM

## 2020-01-01 NOTE — Progress Notes (Signed)
Timothy Zavala DOB: [birth year] Encounter date: 01/01/2020  This is a 49 y.o. male who presents to establish care as well as ER follow up for A fib with RVR.  Chief Complaint  Patient presents with  . Hospitalization Follow-up    History of present illness:  Sx started at dinner. Could hear heartbeat as he was leaving restaurant. Driving home felt a little off balance. Felt tired when he got home, went to bed. Still fast in morning. Off and on tightness in chest. Hasn't called to follow up with cardiology yet because waiting to see who I recommended. No more episodes of racing heart.   Worked out Monday, weds. Trying to get this in more. Used to run regularly but got to point where right knee was causing more trouble.   Hasn't cut out drinking; doesn't feel like it affects him.   Multiple family members with a fib.   Left elbow is still tender. Was going to do pushups other day and still uncomfortable to do specific things. Swelling looks better, but still there. Not ready. Feels tenderness with full extension.   States that wife would like him to talk about hernia - she is concerned that some of belly is hernia. Has been there 3-4 years. Not sure it is worsening. Only bothers him if he is leaning on area.    History reviewed. No pertinent past medical history. Past Surgical History:  Procedure Laterality Date  . KNEE SURGERY Right    x 2; ganglion cyst and scope  . SPINE SURGERY  2010   cervical spine  . VASECTOMY  2006   Allergies  Allergen Reactions  . Meloxicam     REACTION: intolerant, mood changes, does tolerate ibuprofen   Current Meds  Medication Sig  . metoprolol succinate (TOPROL-XL) 25 MG 24 hr tablet Take by mouth daily.    Social History   Tobacco Use  . Smoking status: Former Smoker    Packs/day: 3.00    Years: 15.00    Pack years: 45.00    Types: Cigarettes  . Smokeless tobacco: Never Used  Substance Use Topics  . Alcohol use: Yes   Family  History  Problem Relation Age of Onset  . Arthritis Mother        osteo  . Hypotension Mother   . Arthritis Father        osteo  . Stomach cancer Paternal Uncle   . Atrial fibrillation Paternal Uncle   . Healthy Brother      Review of Systems  Constitutional: Negative for chills, fatigue and fever.  Respiratory: Negative for cough, chest tightness, shortness of breath and wheezing.   Cardiovascular: Negative for chest pain, palpitations and leg swelling.    Objective:  BP 102/80 (BP Location: Left Arm, Patient Position: Sitting, Cuff Size: Large)   Pulse 78   Temp 98 F (36.7 C) (Oral)   Ht 5' 11.25" (1.81 m)   Wt (!) 221 lb 3.2 oz (100.3 kg)   BMI 30.64 kg/m   Weight: (!) 221 lb 3.2 oz (100.3 kg)   BP Readings from Last 3 Encounters:  01/01/20 102/80  12/16/19 120/90  08/15/17 112/78   Wt Readings from Last 3 Encounters:  01/01/20 (!) 221 lb 3.2 oz (100.3 kg)  12/16/19 219 lb 6.4 oz (99.5 kg)  08/15/17 215 lb 14.4 oz (97.9 kg)    Physical Exam Constitutional:      General: He is not in acute distress.    Appearance: He  is well-developed.  Cardiovascular:     Rate and Rhythm: Normal rate and regular rhythm.     Heart sounds: Normal heart sounds. No murmur heard.  No friction rub.  Pulmonary:     Effort: Pulmonary effort is normal. No respiratory distress.     Breath sounds: Normal breath sounds. No wheezing or rales.  Abdominal:     General: Abdomen is flat. Bowel sounds are normal.     Palpations: Abdomen is soft.     Hernia: A hernia (2cm umbilical) is present.  Musculoskeletal:     Right lower leg: No edema.     Left lower leg: No edema.  Neurological:     Mental Status: He is alert and oriented to person, place, and time.  Psychiatric:        Behavior: Behavior normal.     Assessment/Plan:  1. Paroxysmal atrial fibrillation (HCC) Will get baseline echo and refer to cardio for further eval.  - ECHOCARDIOGRAM COMPLETE; Future - Ambulatory  referral to Cardiology  2. Umbilical hernia without obstruction and without gangrene Not bothering patient currently.   3. Olecranon bursitis of left elbow Improved swelling. Still small tenderness tip of elbow.   Return in about 3 months (around 04/02/2020) for physical exam.  Theodis Shove, MD

## 2020-01-05 ENCOUNTER — Encounter: Payer: Self-pay | Admitting: Family Medicine

## 2020-01-21 ENCOUNTER — Ambulatory Visit (HOSPITAL_COMMUNITY): Payer: BC Managed Care – PPO | Attending: Cardiology

## 2020-01-21 ENCOUNTER — Other Ambulatory Visit: Payer: Self-pay

## 2020-01-21 DIAGNOSIS — I480 Paroxysmal atrial fibrillation: Secondary | ICD-10-CM | POA: Insufficient documentation

## 2020-01-22 LAB — ECHOCARDIOGRAM COMPLETE
Area-P 1/2: 3.6 cm2
S' Lateral: 2.7 cm

## 2020-01-25 ENCOUNTER — Encounter: Payer: Self-pay | Admitting: Family Medicine

## 2020-01-26 DIAGNOSIS — I4891 Unspecified atrial fibrillation: Secondary | ICD-10-CM | POA: Diagnosis not present

## 2020-01-26 DIAGNOSIS — R072 Precordial pain: Secondary | ICD-10-CM | POA: Diagnosis not present

## 2020-01-26 DIAGNOSIS — R42 Dizziness and giddiness: Secondary | ICD-10-CM | POA: Diagnosis not present

## 2020-01-26 DIAGNOSIS — R0789 Other chest pain: Secondary | ICD-10-CM | POA: Diagnosis not present

## 2020-01-26 DIAGNOSIS — R5383 Other fatigue: Secondary | ICD-10-CM | POA: Diagnosis not present

## 2020-01-26 DIAGNOSIS — R002 Palpitations: Secondary | ICD-10-CM | POA: Diagnosis not present

## 2020-01-26 DIAGNOSIS — R55 Syncope and collapse: Secondary | ICD-10-CM | POA: Diagnosis not present

## 2020-01-26 DIAGNOSIS — R0602 Shortness of breath: Secondary | ICD-10-CM | POA: Diagnosis not present

## 2020-01-26 DIAGNOSIS — R079 Chest pain, unspecified: Secondary | ICD-10-CM | POA: Diagnosis not present

## 2020-01-27 ENCOUNTER — Other Ambulatory Visit: Payer: Self-pay | Admitting: Family Medicine

## 2020-01-27 MED ORDER — METOPROLOL SUCCINATE ER 25 MG PO TB24: 25.0000 mg | ORAL_TABLET | Freq: Every day | ORAL | 1 refills | Status: DC

## 2020-02-02 NOTE — Progress Notes (Signed)
CARDIOLOGY CONSULT NOTE       Patient ID: Timothy Zavala MRN: [id number] DOB/AGE: [birth year] 49 y.o.  Admit date: (Not on file) Referring Physician: Hassan Zavala Primary Physician: Timothy Banker, MD Primary Cardiologist: Timothy Zavala Reason for Consultation: PAF  Active Problems:   * No active hospital problems. *   HPI:  49 y.o. referred by Dr Timothy Zavala for PAF.  Seen in [facility] ER 12/26/19 with palpitations and lightheadedness over 24 hours. Apple watch indicated afib. Former smoker Does drink ETOH regularly He converted to NSR with iv cardizem  CXR and labs including TSH ok. 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  Family History  Problem Relation Age of Onset  . Arthritis Mother        osteo  . Hypotension Mother   . Arthritis Father        osteo  . Stomach cancer Paternal Uncle   . Atrial fibrillation Paternal Uncle   . Healthy Brother     Social History   Socioeconomic History  . Marital status: Married    Spouse name: Not on file  . Number of children: Not on file  . Years of education: Not on file  . Highest education level: Not on file  Occupational History  . Not on file  Tobacco Use  . Smoking status: Former Smoker    Packs/day: 3.00    Years: 15.00    Pack years: 45.00    Types: Cigarettes  . Smokeless tobacco: Never Used   Substance and Sexual Activity  . Alcohol use: Yes    Comment: 4+ beers/day  . Drug use: No  . Sexual activity: Not on file  Other Topics Concern  . Not on file  Social History Narrative   Work or Chief Strategy Officer      Home Situation:      Spiritual Beliefs:      Lifestyle:   Social Determinants of Health   Financial Resource Strain:   . Difficulty of Paying Living Expenses: Not on file  Food Insecurity:   . Worried About Programme researcher, broadcasting/film/video in the Last Year: Not on file  . Ran Out of Food in the Last Year: Not on file  Transportation Needs:   . Lack of Transportation (Medical): Not on file  . Lack of Transportation (Non-Medical): Not on file  Physical Activity:   . Days of Exercise per Week: Not on file  . Minutes of Exercise per Session: Not on file  Stress:   . Feeling of Stress : Not on file  Social Connections:   . Frequency of Communication with Friends and Family: Not on file  . Frequency of Social Gatherings with Friends and Family: Not on file  . Attends Religious Services: Not on file  . Active Member of Clubs or Organizations: Not  on file  . Attends Zavala Meetings: Not on file  . Marital Status: Not on file  Intimate Partner Violence:   . Fear of Current or Ex-Partner: Not on file  . Emotionally Abused: Not on file  . Physically Abused: Not on file  . Sexually Abused: Not on file    Past Surgical History:  Procedure Laterality Date  . KNEE SURGERY Right    x 2; ganglion cyst and scope  . SPINE SURGERY  2010   cervical spine  . VASECTOMY  2006      Current Outpatient Medications:  .  flecainide (TAMBOCOR) 50 MG tablet, Take 50 mg by mouth in the morning and at bedtime., Disp: , Rfl:  .  metoprolol succinate (TOPROL-XL) 25 MG 24 hr tablet, Take 25 mg by mouth daily., Disp: , Rfl:     Physical Exam:  Affect appropriate Healthy:  appears stated age HEENT: normal Neck supple with no adenopathy JVP normal no bruits no  thyromegaly Lungs clear with no wheezing and good diaphragmatic motion Heart:  S1/S2 no murmur, no rub, gallop or click PMI normal Abdomen: benighn, BS positve, no tenderness, no AAA no bruit.  No HSM or HJR Distal pulses intact with no bruits No edema Neuro non-focal Skin warm and dry No muscular weakness   Labs:  No results found for: WBC, HGB, HCT, MCV, PLT No results for input(s): NA, K, CL, CO2, BUN, CREATININE, CALCIUM, PROT, BILITOT, ALKPHOS, ALT, AST, GLUCOSE in the last 168 hours.  Invalid input(s): LABALBU No results found for: CKTOTAL, CKMB, CKMBINDEX, TROPONINI  Lab Results  Component Value Date   CHOL 209 (A) 12/30/2019   CHOL 177 08/15/2017   CHOL 164 07/27/2010   Lab Results  Component Value Date   HDL 60 12/30/2019   HDL 52.20 08/15/2017   HDL 47.50 07/27/2010   Lab Results  Component Value Date   LDLCALC 134 12/30/2019   LDLCALC 116 (H) 08/15/2017   LDLCALC 106 (H) 07/27/2010   Lab Results  Component Value Date   TRIG 61 12/30/2019   TRIG 46.0 08/15/2017   TRIG 54.0 07/27/2010   Lab Results  Component Value Date   CHOLHDL 3 08/15/2017   CHOLHDL 3 07/27/2010   No results found for: LDLDIRECT    Radiology: ECHOCARDIOGRAM COMPLETE  Result Date: 01/22/2020    ECHOCARDIOGRAM REPORT   Patient Name:   Timothy Zavala Date of Exam: 01/21/2020 Medical Rec #:  [id number]             Height:       71.3 in Accession #:    [id number]            Weight:       221.2 lb Date of Birth:  04/18/1971             BSA:          2.207 m Patient Age:    49 years              BP:           102/80 mmHg Patient Gender: M                     HR:           71 bpm. Exam Location:  [address] Procedure: 2D Echo, 3D Echo, Cardiac Doppler, Color Doppler and Strain Analysis Indications:    I48.91 Atrial Fibrillation  History:  Patient has no prior history of Echocardiogram examinations.  Sonographer:    Daphine Deutscher RDCS Referring Phys: [id number] Timothy Zavala  Timothy Zavala IMPRESSIONS  1. Left ventricular ejection fraction, by estimation, is 60 to 65%. The left ventricle has normal function. The left ventricle has no regional wall motion abnormalities. Left ventricular diastolic parameters were normal.  2. Right ventricular systolic function is normal. The right ventricular size is normal. Tricuspid regurgitation signal is inadequate for assessing PA pressure.  3. The mitral valve is normal in structure. Trivial mitral valve regurgitation. No evidence of mitral stenosis.  4. The aortic valve is tricuspid. Aortic valve regurgitation is not visualized. No aortic stenosis is present.  5. Aortic dilatation noted. There is borderline dilatation of the ascending aorta measuring 38 mm.  6. The inferior vena cava is normal in size with greater than 50% respiratory variability, suggesting right atrial pressure of 3 mmHg. Comparison(s): No prior Echocardiogram. Conclusion(s)/Recommendation(s): Normal biventricular function without evidence of hemodynamically significant valvular heart disease. FINDINGS  Left Ventricle: Left ventricular ejection fraction, by estimation, is 60 to 65%. The left ventricle has normal function. The left ventricle has no regional wall motion abnormalities. The left ventricular internal cavity size was normal in size. There is  no left ventricular hypertrophy. Left ventricular diastolic parameters were normal. Right Ventricle: The right ventricular size is normal. No increase in right ventricular wall thickness. Right ventricular systolic function is normal. Tricuspid regurgitation signal is inadequate for assessing PA pressure. Left Atrium: Left atrial size was normal in size. Right Atrium: Right atrial size was normal in size. Pericardium: There is no evidence of pericardial effusion. Mitral Valve: The mitral valve is normal in structure. Trivial mitral valve regurgitation. No evidence of mitral valve stenosis. Tricuspid Valve: The tricuspid valve is normal  in structure. Tricuspid valve regurgitation is not demonstrated. No evidence of tricuspid stenosis. Aortic Valve: The aortic valve is tricuspid. Aortic valve regurgitation is not visualized. No aortic stenosis is present. Pulmonic Valve: The pulmonic valve was grossly normal. Pulmonic valve regurgitation is not visualized. No evidence of pulmonic stenosis. Aorta: Aortic dilatation noted. There is borderline dilatation of the ascending aorta measuring 38 mm. Venous: The inferior vena cava is normal in size with greater than 50% respiratory variability, suggesting right atrial pressure of 3 mmHg. IAS/Shunts: The atrial septum is grossly normal.  LEFT VENTRICLE PLAX 2D LVIDd:         4.80 cm  Diastology LVIDs:         2.70 cm  LV e' lateral:   12.30 cm/s LV PW:         1.10 cm  LV E/e' lateral: 6.3 LV IVS:        1.10 cm  LV e' medial:    7.07 cm/s LVOT diam:     2.20 cm  LV E/e' medial:  11.0 LV SV:         77 LV SV Index:   35       2D Longitudinal Strain LVOT Area:     3.80 cm 2D Strain GLS (A2C):   -22.0 %                         2D Strain GLS (A3C):   -21.8 %                         2D Strain GLS (A4C):   -21.0 %  2D Strain GLS Avg:     -21.6 %                          3D Volume EF:                         3D EF:        65 %                         LV EDV:       133 ml                         LV ESV:       46 ml                         LV SV:        87 ml RIGHT VENTRICLE RV Basal diam:  4.40 cm RV S prime:     15.50 cm/s TAPSE (M-mode): 2.4 cm LEFT ATRIUM             Index       RIGHT ATRIUM           Index LA diam:        3.70 cm 1.68 cm/m  RA Area:     16.40 cm LA Vol (A2C):   31.1 ml 14.09 ml/m RA Volume:   47.70 ml  21.61 ml/m LA Vol (A4C):   35.5 ml 16.09 ml/m LA Biplane Vol: 34.8 ml 15.77 ml/m  AORTIC VALVE LVOT Vmax:   88.15 cm/s LVOT Vmean:  62.700 cm/s LVOT VTI:    0.202 m  AORTA Ao Root diam: 4.00 cm Ao Asc diam:  3.80 cm MITRAL VALVE MV Area (PHT): 3.60 cm    SHUNTS MV  Decel Time: 211 msec    Systemic VTI:  0.20 m MV E velocity: 78.10 cm/s  Systemic Diam: 2.20 cm MV A velocity: 76.20 cm/s MV E/A ratio:  1.02 Jodelle RedBridgette Christopher MD Electronically signed by Jodelle RedBridgette Christopher MD Signature Date/Time: 01/22/2020/12:20:31 AM    Final     EKG: NSR normal ECG 02/03/20    ASSESSMENT AND PLAN:   1. PAF:  Improved on beta blocker / flecainide f/u ETT for AAT If he has recurrence will refer to EP for ablation Encouraged him to d/c ETOH all together   2. ETOH:  Abstain counseled at length no family history of ETOH abuse   Signed: Charlton Hawseter Velmer Broadfoot 02/02/2020, 8:59 AM

## 2020-02-03 ENCOUNTER — Ambulatory Visit: Payer: BC Managed Care – PPO | Admitting: Cardiovascular Disease

## 2020-02-03 ENCOUNTER — Other Ambulatory Visit: Payer: Self-pay

## 2020-02-03 ENCOUNTER — Encounter: Payer: Self-pay | Admitting: Cardiovascular Disease

## 2020-02-03 VITALS — BP 116/80 | HR 63 | Ht 70.5 in | Wt 221.0 lb

## 2020-02-03 DIAGNOSIS — I480 Paroxysmal atrial fibrillation: Secondary | ICD-10-CM | POA: Diagnosis not present

## 2020-02-03 NOTE — Patient Instructions (Signed)
Medication Instructions:  *If you need a refill on your cardiac medications before your next appointment, please call your pharmacy*  Lab Work: If you have labs (blood work) drawn today and your tests are completely normal, you will receive your results only by:  MyChart Message (if you have MyChart) OR  A paper copy in the mail If you have any lab test that is abnormal or we need to change your treatment, we will call you to review the results.  Testing/Procedures: Your physician has requested that you have an exercise tolerance test. For further information please visit [email or web address] Please also follow instruction sheet, as given.  Follow-Up: At [facility], you and your health needs are our priority.  As part of our continuing mission to provide you with exceptional heart care, we have created designated Provider Care Teams.  These Care Teams include your primary Cardiologist (physician) and Advanced Practice Providers (APPs -  Physician Assistants and Nurse Practitioners) who all work together to provide you with the care you need, when you need it.  We recommend signing up for the patient portal called "MyChart".  Sign up information is provided on this After Visit Summary.  MyChart is used to connect with patients for Virtual Visits (Telemedicine).  Patients are able to view lab/test results, encounter notes, upcoming appointments, etc.  Non-urgent messages can be sent to your provider as well.   To learn more about what you can do with MyChart, go to [email or web address].    Your next appointment:   6 month(s)  The format for your next appointment:   In Person  Provider:   You may see Dr. Eden Emms or one of the following Advanced Practice Providers on your designated Care Team:    Norma Fredrickson, NP  Nada Boozer, NP  Georgie Chard, NP

## 2020-02-10 ENCOUNTER — Encounter: Payer: BC Managed Care – PPO | Admitting: Family Medicine

## 2020-02-12 ENCOUNTER — Other Ambulatory Visit (HOSPITAL_COMMUNITY): Payer: BC Managed Care – PPO

## 2020-02-13 ENCOUNTER — Other Ambulatory Visit (HOSPITAL_COMMUNITY)
Admission: RE | Admit: 2020-02-13 | Discharge: 2020-02-13 | Disposition: A | Discharge status: Home / Self Care | Payer: BC Managed Care – PPO | Source: Ambulatory Visit | Attending: Cardiovascular Disease | Admitting: Cardiovascular Disease

## 2020-02-13 NOTE — Progress Notes (Signed)
Pt did not want to be covid tested today 02/13/20 for his procedure on Tues 02/16/20 with Dr. Eden Emms, as he said the office did not tell him he had to quarantine after being tested. Pt stated that he had to go to church tomorrow and will call the office on Monday.

## 2020-03-04 ENCOUNTER — Ambulatory Visit: Payer: BC Managed Care – PPO | Admitting: Cardiovascular Disease

## 2020-05-02 ENCOUNTER — Encounter: Payer: Self-pay | Admitting: Family Medicine

## 2020-05-04 ENCOUNTER — Other Ambulatory Visit: Payer: Self-pay | Admitting: Family Medicine

## 2020-05-04 MED ORDER — METOPROLOL SUCCINATE ER 25 MG PO TB24
25.0000 mg | ORAL_TABLET | Freq: Every day | ORAL | 0 refills | Status: DC
Start: 2020-05-04 — End: 2020-08-18

## 2020-05-04 MED ORDER — FLECAINIDE ACETATE 50 MG PO TABS: 50.0000 mg | ORAL_TABLET | Freq: Two times a day (BID) | ORAL | 0 refills | Status: AC

## 2020-05-05 ENCOUNTER — Encounter (HOSPITAL_BASED_OUTPATIENT_CLINIC_OR_DEPARTMENT_OTHER): Payer: Self-pay

## 2020-05-05 ENCOUNTER — Emergency Department (HOSPITAL_BASED_OUTPATIENT_CLINIC_OR_DEPARTMENT_OTHER): Payer: BC Managed Care – PPO

## 2020-05-05 ENCOUNTER — Emergency Department (HOSPITAL_BASED_OUTPATIENT_CLINIC_OR_DEPARTMENT_OTHER)
Admission: EM | Admit: 2020-05-05 | Discharge: 2020-05-05 | Disposition: A | Discharge status: Home / Self Care | Payer: BC Managed Care – PPO | Attending: Emergency Medicine | Admitting: Emergency Medicine

## 2020-05-05 ENCOUNTER — Other Ambulatory Visit: Payer: Self-pay

## 2020-05-05 DIAGNOSIS — Z87891 Personal history of nicotine dependence: Secondary | ICD-10-CM | POA: Diagnosis not present

## 2020-05-05 DIAGNOSIS — M25561 Pain in right knee: Secondary | ICD-10-CM | POA: Diagnosis not present

## 2020-05-05 DIAGNOSIS — M1711 Unilateral primary osteoarthritis, right knee: Secondary | ICD-10-CM | POA: Diagnosis not present

## 2020-05-05 MED ORDER — KETOROLAC TROMETHAMINE 30 MG/ML IJ SOLN
30.0000 mg | Freq: Once | INTRAMUSCULAR | Status: AC
  Administered 2020-05-05: 30 mg via INTRAMUSCULAR
  Filled 2020-05-05: qty 1

## 2020-05-05 NOTE — ED Triage Notes (Signed)
Was running on Tuesday 11/30 due to an emergency phone call from home, denies twisting injury, states pain is just from running, pain level of 3, slight swelling of 1 to medial aspect of right knee, pulses normal, c/s/m normal

## 2020-05-05 NOTE — Discharge Instructions (Addendum)
Please read instructions below. Apply ice to your knee for 20 minutes at a time. Elevate it as much as possible to help with swelling and pain. You can take 600mg  ibuprofen every 6 hours as needed for pain. Schedule an appointment with your orthopedic specialist. Return to the ER for new or concerning symptoms.

## 2020-05-05 NOTE — ED Provider Notes (Signed)
[facility] EMERGENCY DEPARTMENT Provider Note   CSN: [id number] Arrival date & time: 05/05/20  3235     History Chief Complaint  Patient presents with  . Knee Pain    right, injured while running    Timothy Zavala is a 49 y.o. male presenting to the ED with complaint of acute exacerbation of chronic right knee pain. Patient states he has had longstanding history of issues to right knee including osgood schlatters, arthritis, surgery and chronic aching pain. He states at baseline he can't do much activity without aggravating his knee. On Tuesday this week he received a phone call that his wife had had another stroke. He states he ran to his locker at work and immediately he began feeling a throbbing pain to his right knee. He has had gradually worsening swelling and pain with weightbearing and range of motion. He treated last night with 60 mg of ibuprofen which provided moderate relief. This morning he took 400 mg of ibuprofen which did not help as much. He presents today due to pain and being unable to bear weight. He is followed by Dr. Luiz Blare with orthopedics. He has 2 sets of crutches at home, however does not have knee immobilizers anymore. He presents today with knee sleeve in place.  The history is provided by the patient.       Past Medical History:  Diagnosis Date  . Atrial fibrillation (HCC) 11/2019   takes metoprolol and flecanide    Patient Active Problem List   Diagnosis Date Noted  . Umbilical hernia without obstruction and without gangrene 03/14/2017  . Alcohol abuse 03/14/2017  . 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 KNEE PAIN, RIGHT 07/05/2010  . CERVICAL RADICULOPATHY, RIGHT 07/05/2010    Past Surgical History:  Procedure Laterality Date  . KNEE SURGERY Right    x 2; ganglion cyst and scope  . SPINE SURGERY  2010   cervical spine  . VASECTOMY  2006       Family History  Problem Relation Age of Onset  . Arthritis Mother        osteo  .  Hypotension Mother   . Arthritis Father        osteo  . Stomach cancer Paternal Uncle   . Atrial fibrillation Paternal Uncle   . Healthy Brother     Social History   Tobacco Use  . Smoking status: Former Smoker    Packs/day: 3.00    Years: 15.00    Pack years: 45.00    Types: Cigarettes  . Smokeless tobacco: Never Used  Substance Use Topics  . Alcohol use: Yes    Comment: 4+ beers/day  . Drug use: No    Home Medications Prior to Admission medications   Medication Sig Start Date End Date Taking? Authorizing Provider  flecainide (TAMBOCOR) 50 MG tablet Take 1 tablet (50 mg total) by mouth in the morning and at bedtime. 05/04/20 06/03/20  Wynn Banker, MD  metoprolol succinate (TOPROL-XL) 25 MG 24 hr tablet Take 1 tablet (25 mg total) by mouth daily. 05/04/20   Wynn Banker, MD    Allergies    Prednisone  Review of Systems   Review of Systems  Musculoskeletal: Positive for arthralgias and joint swelling.  Skin: Negative for wound.  All other systems reviewed and are negative.   Physical Exam Updated Vital Signs BP 127/83 (BP Location: Right Arm)   Pulse 77   Temp 99.2 F (37.3 C) (Oral)   Resp 18  Ht 5\' 10"  (1.778 m)   Wt 104.5 kg   SpO2 97%   BMI 33.04 kg/m   Physical Exam Vitals and nursing note reviewed.  Constitutional:      General: He is not in acute distress.    Appearance: He is well-developed.  HENT:     Head: Normocephalic and atraumatic.  Eyes:     Conjunctiva/sclera: Conjunctivae normal.  Cardiovascular:     Rate and Rhythm: Normal rate.  Pulmonary:     Effort: Pulmonary effort is normal.  Musculoskeletal:     Comments: Right knee with moderate swelling. No redness or warmth. No deformities. There is an old surgical scar present anteriorly just medial to the midline. He has tenderness to the anterior aspect chest medial to the midline. Patient is able to actively flex his knee to 90 degrees though this does cause some pain. Knee  appears stable  Neurological:     Mental Status: He is alert.  Psychiatric:        Mood and Affect: Mood normal.        Behavior: Behavior normal.     ED Results / Procedures / Treatments   Labs (all labs ordered are listed, but only abnormal results are displayed) Labs Reviewed - No data to display  EKG None  Radiology DG Knee Complete 4 Views Right  Result Date: 05/05/2020 CLINICAL DATA:  Pain.  Edema. EXAM: RIGHT KNEE - COMPLETE 4+ VIEW COMPARISON:  MRI report 08/04/2010. FINDINGS: Small knee joint effusion cannot be excluded. Infrapatellar soft tissue swelling possibly related to patellar tendinitis. Mild tricompartment degenerative change. Non fused tibial tuberosity. No acute bony abnormality. No evidence of fracture or dislocation. IMPRESSION: 1. Small knee joint effusion cannot be excluded. Infrapatellar soft tissue swelling. Patellar tendinitis cannot be excluded. 2. Mild tricompartment degenerative change. No acute bony abnormality. Electronically Signed   By: 10/04/2010  Register   On: 05/05/2020 09:24    Procedures Procedures (including critical care time)  Medications Ordered in ED Medications  ketorolac (TORADOL) 30 MG/ML injection 30 mg (has no administration in time range)    ED Course  I have reviewed the triage vital signs and the nursing notes.  Pertinent labs & imaging results that were available during my care of the patient were reviewed by me and considered in my medical decision making (see chart for details).    MDM Rules/Calculators/A&P                          Patient presenting with acute exacerbation of chronic right knee pain after running earlier this week. He is having worsening pain with weightbearing and range of motion, with associated swelling. X-rays negative for acute pathology. He has moderate swelling appreciated, no redness or warmth to suggest gouty arthritis or infectious etiology. No trauma to the knee. Knee appears stable on exam.  Recommend continue ibuprofen, 600 mg every 6-8 hours, ice, elevation.14/07/2019 He is provided with a knee brace for support. He has crutches at home. He is instructed to follow-up with his orthopedist is agreeable to plan, appropriate for discharge.  Discussed results, findings, treatment and follow up. Patient advised of return precautions. Patient verbalized understanding and agreed with plan.  Final Clinical Impression(s) / ED Diagnoses Final diagnoses:  Right knee pain, unspecified chronicity    Rx / DC Orders ED Discharge Orders    None       Lelani Garnett, Marland Kitchen N, PA-C 05/05/20 1039    Pfeiffer, Coats,  MD 05/13/20 1540

## 2020-05-09 ENCOUNTER — Encounter: Payer: Self-pay | Admitting: Family Medicine

## 2020-05-09 DIAGNOSIS — M1711 Unilateral primary osteoarthritis, right knee: Secondary | ICD-10-CM | POA: Diagnosis not present

## 2020-05-30 DIAGNOSIS — M1711 Unilateral primary osteoarthritis, right knee: Secondary | ICD-10-CM | POA: Diagnosis not present

## 2020-06-10 ENCOUNTER — Ambulatory Visit (INDEPENDENT_AMBULATORY_CARE_PROVIDER_SITE_OTHER): Payer: BC Managed Care – PPO | Admitting: Family Medicine

## 2020-06-10 ENCOUNTER — Encounter: Payer: Self-pay | Admitting: Family Medicine

## 2020-06-10 ENCOUNTER — Other Ambulatory Visit: Payer: Self-pay

## 2020-06-10 VITALS — BP 124/82 | HR 69 | Temp 97.9°F | Ht 72.0 in | Wt 226.5 lb

## 2020-06-10 DIAGNOSIS — Z1211 Encounter for screening for malignant neoplasm of colon: Secondary | ICD-10-CM | POA: Diagnosis not present

## 2020-06-10 DIAGNOSIS — Z13220 Encounter for screening for lipoid disorders: Secondary | ICD-10-CM

## 2020-06-10 DIAGNOSIS — N3943 Post-void dribbling: Secondary | ICD-10-CM

## 2020-06-10 DIAGNOSIS — N401 Enlarged prostate with lower urinary tract symptoms: Secondary | ICD-10-CM

## 2020-06-10 DIAGNOSIS — K429 Umbilical hernia without obstruction or gangrene: Secondary | ICD-10-CM

## 2020-06-10 DIAGNOSIS — Z0000 Encounter for general adult medical examination without abnormal findings: Secondary | ICD-10-CM | POA: Diagnosis not present

## 2020-06-10 DIAGNOSIS — Z131 Encounter for screening for diabetes mellitus: Secondary | ICD-10-CM

## 2020-06-10 DIAGNOSIS — I480 Paroxysmal atrial fibrillation: Secondary | ICD-10-CM

## 2020-06-10 NOTE — Progress Notes (Signed)
Timothy Zavala DOB: August 11, 1970 Encounter date: 06/10/2020  This is a 50 y.o. male who presents for complete physical   History of present illness/Additional concerns: Last visit with me was 01/01/2020 to establish care.  He was also having ER follow-up at that time for A. fib with RVR.  No concerns today. Not having a fib episodes.   Seeing Dr. Lequita Halt for knee pain; had cortisone shot a couple of weeks ago. Has been bad for awhile, but after wife had last stroke went to run for phone and injured it (this was in December).   No family history of colon cancer.   He has started back on treadmill/exercise at gym. Doing an hour+ on treadmill/eliptical. Eating healthier. Has cut back on alcohol after ER visit. 2 or 3 drinks per night; which is a significant decrease from 6+ he was doing before.   Did not get vaccinated for COVID. Did have infection last march.   Following with eye doc regularly; due for dental visit.   Past Medical History:  Diagnosis Date  . Atrial fibrillation (HCC) 11/2019   takes metoprolol and flecanide   Past Surgical History:  Procedure Laterality Date  . KNEE SURGERY Right    x 2; ganglion cyst and scope  . SPINE SURGERY  2010   cervical spine  . VASECTOMY  2006   Allergies  Allergen Reactions  . Prednisone     Mood changes   Current Meds  Medication Sig  . metoprolol succinate (TOPROL-XL) 25 MG 24 hr tablet Take 1 tablet (25 mg total) by mouth daily.   Social History   Tobacco Use  . Smoking status: Former Smoker    Packs/day: 3.00    Years: 15.00    Pack years: 45.00    Types: Cigarettes  . Smokeless tobacco: Never Used  Substance Use Topics  . Alcohol use: Yes    Comment: 4+ beers/day   Family History  Problem Relation Age of Onset  . Arthritis Mother        osteo  . Hypotension Mother   . Arthritis Father        osteo  . Stomach cancer Paternal Uncle   . Atrial fibrillation Paternal Uncle   . Healthy Brother   . Stroke  Maternal Grandfather      Review of Systems  Constitutional: Negative for activity change, appetite change, chills, fatigue, fever and unexpected weight change.  HENT: Negative for congestion, ear pain, hearing loss, sinus pressure, sinus pain, sore throat and trouble swallowing.   Eyes: Negative for pain and visual disturbance.  Respiratory: Negative for cough, chest tightness, shortness of breath and wheezing.   Cardiovascular: Negative for chest pain, palpitations and leg swelling.  Gastrointestinal: Negative for abdominal distention, abdominal pain, blood in stool, constipation, diarrhea, nausea and vomiting.  Genitourinary: Negative for decreased urine volume, difficulty urinating, dysuria, penile pain and testicular pain.  Musculoskeletal: Positive for arthralgias (right knee pain). Negative for back pain and joint swelling.  Skin: Negative for rash.  Neurological: Negative for dizziness, weakness, numbness and headaches.  Hematological: Negative for adenopathy. Does not bruise/bleed easily.  Psychiatric/Behavioral: Negative for agitation, sleep disturbance and suicidal ideas. The patient is not nervous/anxious.     CBC: No results found for: WBC, HGB, HCT, MCH, MCHC, RDW, PLT, MPV CMP: Lab Results  Component Value Date   NA 141 07/27/2010   K 5.1 07/31/2010   CL 109 07/27/2010   CO2 30 07/27/2010   GLUCOSE 83 07/27/2010  BUN 20 07/27/2010   CREATININE 0.9 07/27/2010   CALCIUM 9.2 07/27/2010   PROT 6.3 07/27/2010   BILITOT 0.8 07/27/2010   ALKPHOS 40 07/27/2010   ALT 33 07/27/2010   AST 21 07/27/2010   LIPID: Lab Results  Component Value Date   CHOL 209 (A) 12/30/2019   TRIG 61 12/30/2019   HDL 60 12/30/2019   LDLCALC 134 12/30/2019    Objective:  BP 124/82 (BP Location: Left Arm, Patient Position: Sitting, Cuff Size: Large)   Pulse 69   Temp 97.9 F (36.6 C) (Oral)   Ht 6' (1.829 m)   Wt 226 lb 8 oz (102.7 kg)   SpO2 97%   BMI 30.72 kg/m   Weight: 226  lb 8 oz (102.7 kg)   BP Readings from Last 3 Encounters:  06/10/20 124/82  05/05/20 (!) 129/92  02/03/20 116/80   Wt Readings from Last 3 Encounters:  06/10/20 226 lb 8 oz (102.7 kg)  05/05/20 230 lb 4.8 oz (104.5 kg)  02/03/20 221 lb (100.2 kg)    Physical Exam Constitutional:      General: He is not in acute distress.    Appearance: He is well-developed and well-nourished.  HENT:     Head: Normocephalic and atraumatic.     Right Ear: External ear normal.     Left Ear: External ear normal.     Nose: Nose normal.     Mouth/Throat:     Mouth: Oropharynx is clear and moist.     Pharynx: No oropharyngeal exudate.  Eyes:     Conjunctiva/sclera: Conjunctivae normal.     Pupils: Pupils are equal, round, and reactive to light.  Neck:     Thyroid: No thyromegaly.  Cardiovascular:     Rate and Rhythm: Normal rate and regular rhythm.     Pulses: Intact distal pulses.     Heart sounds: Normal heart sounds. No murmur heard. No friction rub. No gallop.   Pulmonary:     Effort: Pulmonary effort is normal. No respiratory distress.     Breath sounds: Normal breath sounds. No stridor. No wheezing or rales.  Abdominal:     General: Bowel sounds are normal.     Palpations: Abdomen is soft.     Hernia: A hernia is present. Hernia is present in the umbilical area (2cm).  Musculoskeletal:        General: Normal range of motion.     Cervical back: Neck supple.  Skin:    General: Skin is warm and dry.  Neurological:     Mental Status: He is alert and oriented to person, place, and time.  Psychiatric:        Mood and Affect: Mood and affect normal.        Behavior: Behavior normal.        Thought Content: Thought content normal.        Judgment: Judgment normal.     Assessment/Plan: Health Maintenance Due  Topic Date Due  . COLONOSCOPY (Pts 45-39yrs Insurance coverage will need to be confirmed)  Never done   Health Maintenance reviewed.  1. Preventative health care Keep up  with regular exercise. Keep up with healthier eating and he has cut back on alcohol significantly which is great.  2. Colon cancer screening - Cologuard  3. Benign prostatic hyperplasia with post-void dribbling - PSA; Future  4. Umbilical hernia without obstruction and without gangrene Not causing pain. If worsening can refer to gen surgery.  5. Lipid screening -  Lipid panel; Future  6. Screening for diabetes mellitus - Comprehensive metabolic panel; Future - Hemoglobin A1c; Future  7. Paroxysmal atrial fibrillation ([place]) Keep follow up with cardiology; he has been rate controled and exam today is normal. - CBC with Differential/Platelet; Future  Return in about 1 year (around 06/10/2021) for physical exam with bloodwork in 6 months.  Micheline Rough, MD

## 2020-06-10 NOTE — Patient Instructions (Addendum)
You can go to [email or web address] website to schedule COVID vaccination at your convenience. I do recommend this as it will improve your response should you get infected.   Zoster Vaccine, Recombinant injection What is this medicine? ZOSTER VACCINE (ZOS ter vak SEEN) is used to prevent shingles in adults 52 years old and over. This vaccine is not used to treat shingles or nerve pain from shingles. This medicine may be used for other purposes; ask your health care provider or pharmacist if you have questions. COMMON BRAND NAME(S): [facility] What should I tell my health care provider before I take this medicine? They need to know if you have any of these conditions:  blood disorders or disease  cancer like leukemia or lymphoma  immune system problems or therapy  an unusual or allergic reaction to vaccines, other medications, foods, dyes, or preservatives  pregnant or trying to get pregnant  breast-feeding How should I use this medicine? This vaccine is for injection in a muscle. It is given by a health care professional. Talk to your pediatrician regarding the use of this medicine in children. This medicine is not approved for use in children. Overdosage: If you think you have taken too much of this medicine contact a poison control center or emergency room at once. NOTE: This medicine is only for you. Do not share this medicine with others. What if I miss a dose? Keep appointments for follow-up (booster) doses as directed. It is important not to miss your dose. Call your doctor or health care professional if you are unable to keep an appointment. What may interact with this medicine?  medicines that suppress your immune system  medicines to treat cancer  steroid medicines like prednisone or cortisone This list may not describe all possible interactions. Give your health care provider a list of all the medicines, herbs, non-prescription drugs, or dietary supplements you use. Also  tell them if you smoke, drink alcohol, or use illegal drugs. Some items may interact with your medicine. What should I watch for while using this medicine? Visit your doctor for regular check ups. This vaccine, like all vaccines, may not fully protect everyone. What side effects may I notice from receiving this medicine? Side effects that you should report to your doctor or health care professional as soon as possible:  allergic reactions like skin rash, itching or hives, swelling of the face, lips, or tongue  breathing problems Side effects that usually do not require medical attention (report these to your doctor or health care professional if they continue or are bothersome):  chills  headache  fever  nausea, vomiting  redness, warmth, pain, swelling or itching at site where injected  tiredness This list may not describe all possible side effects. Call your doctor for medical advice about side effects. You may report side effects to FDA at 1-800-FDA-1088. Where should I keep my medicine? This vaccine is only given in a clinic, pharmacy, doctor's office, or other health care setting and will not be stored at home. NOTE: This sheet is a summary. It may not cover all possible information. If you have questions about this medicine, talk to your doctor, pharmacist, or health care provider.  2020 Elsevier/Gold Standard (2016-12-31 13:20:30)

## 2020-07-14 DIAGNOSIS — M25561 Pain in right knee: Secondary | ICD-10-CM | POA: Diagnosis not present

## 2020-07-28 NOTE — Progress Notes (Deleted)
CARDIOLOGY CONSULT NOTE       Patient ID: Timothy Zavala MRN: [id number] DOB/AGE: [birth year] 50 y.o.  Admit date: (Not on file) Referring Physician: Hassan Rowan Primary Physician: Wynn Banker, MD Primary Cardiologist: New Reason for Consultation: PAF  Active Problems:   * No active hospital problems. *   HPI:  50 y.o. referred by Dr Hassan Rowan 9.1.21  for PAF.  Seen in [facility] ER 12/26/19 with palpitations and lightheadedness over 24 hours. Apple watch indicated afib. Former smoker Does drink ETOH regularly He converted to NSR with iv cardizem  CXR and labs including TSH ok. 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 Atrial fibrillation (HCC) 11/2019   takes metoprolol and flecanide    Family History  Problem Relation Age of Onset  . Arthritis Mother        osteo  . Hypotension Mother   . Arthritis Father        osteo  . Stomach cancer Paternal Uncle   . Atrial fibrillation Paternal Uncle   . Healthy Brother   . Stroke Maternal Grandfather     Social History    Socioeconomic History  . Marital status: Married    Spouse name: Not on file  . Number of children: Not on file  . Years of education: Not on file  . Highest education level: Not on file  Occupational History  . Not on file  Tobacco Use  . Smoking status: Former Smoker    Packs/day: 3.00    Years: 15.00    Pack years: 45.00    Types: Cigarettes  . Smokeless tobacco: Never Used  Substance and Sexual Activity  . Alcohol use: Yes    Comment: 4+ beers/day  . Drug use: No  . Sexual activity: Not on file  Other Topics Concern  . Not on file  Social History Narrative   Work or Chief Strategy Officer      Home Situation:      Spiritual Beliefs:      Lifestyle:   Social Determinants of Health   Financial Resource Strain: Not on file  Food Insecurity: Not on file  Transportation Needs: Not on file  Physical Activity: Not on file  Stress: Not on file  Social Connections: Not on file  Intimate Partner Violence: Not on file    Past Surgical History:  Procedure Laterality Date  . KNEE SURGERY Right    x 2; ganglion cyst and scope  . SPINE SURGERY  2010  cervical spine  . VASECTOMY  2006      Current Outpatient Medications:  .  flecainide (TAMBOCOR) 50 MG tablet, Take 1 tablet (50 mg total) by mouth in the morning and at bedtime., Disp: 60 tablet, Rfl: 0 .  metoprolol succinate (TOPROL-XL) 25 MG 24 hr tablet, Take 1 tablet (25 mg total) by mouth daily., Disp: 90 tablet, Rfl: 0    Physical Exam:  Affect appropriate Healthy:  appears stated age HEENT: normal Neck supple with no adenopathy JVP normal no bruits no thyromegaly Lungs clear with no wheezing and good diaphragmatic motion Heart:  S1/S2 no murmur, no rub, gallop or click PMI normal Abdomen: benighn, BS positve, no tenderness, no AAA no bruit.  No HSM or HJR Distal pulses intact with no bruits No edema Neuro non-focal Skin warm and dry No muscular weakness   Labs:  No results found for: WBC,  HGB, HCT, MCV, PLT No results for input(s): NA, K, CL, CO2, BUN, CREATININE, CALCIUM, PROT, BILITOT, ALKPHOS, ALT, AST, GLUCOSE in the last 168 hours.  Invalid input(s): LABALBU No results found for: CKTOTAL, CKMB, CKMBINDEX, TROPONINI  Lab Results  Component Value Date   CHOL 209 (A) 12/30/2019   CHOL 177 08/15/2017   CHOL 164 07/27/2010   Lab Results  Component Value Date   HDL 60 12/30/2019   HDL 52.20 08/15/2017   HDL 47.50 07/27/2010   Lab Results  Component Value Date   LDLCALC 134 12/30/2019   LDLCALC 116 (H) 08/15/2017   LDLCALC 106 (H) 07/27/2010   Lab Results  Component Value Date   TRIG 61 12/30/2019   TRIG 46.0 08/15/2017   TRIG 54.0 07/27/2010   Lab Results  Component Value Date   CHOLHDL 3 08/15/2017   CHOLHDL 3 07/27/2010   No results found for: LDLDIRECT    Radiology: No results found.  EKG: NSR normal ECG 02/03/20    ASSESSMENT AND PLAN:   1. PAF:  Improved on beta blocker / flecainide f/u ETT for AAT If he has recurrence will refer to EP for ablation Encouraged him to d/c ETOH all together   2. ETOH:  Abstain counseled at length no family history of ETOH abuse   3. Ortho:  F/u Dr Despina Hick chronic recent injury has had steroid injection   4. HLD:  LDL 134 ***  ETT: flecainide Calcium Score   Signed: Charlton Haws 07/28/2020, 6:02 PM

## 2020-08-03 ENCOUNTER — Ambulatory Visit: Payer: BC Managed Care – PPO | Admitting: Cardiovascular Disease

## 2020-08-04 DIAGNOSIS — M25561 Pain in right knee: Secondary | ICD-10-CM | POA: Diagnosis not present

## 2020-08-10 DIAGNOSIS — S83241A Other tear of medial meniscus, current injury, right knee, initial encounter: Secondary | ICD-10-CM | POA: Diagnosis not present

## 2020-08-10 DIAGNOSIS — M25561 Pain in right knee: Secondary | ICD-10-CM | POA: Diagnosis not present

## 2020-08-18 ENCOUNTER — Other Ambulatory Visit: Payer: Self-pay | Admitting: Family Medicine

## 2020-08-19 MED ORDER — METOPROLOL SUCCINATE ER 25 MG PO TB24: 25.0000 mg | ORAL_TABLET | Freq: Every day | ORAL | 1 refills | Status: AC

## 2020-09-05 ENCOUNTER — Encounter: Payer: Self-pay | Admitting: Family Medicine

## 2020-09-08 DIAGNOSIS — B029 Zoster without complications: Secondary | ICD-10-CM | POA: Diagnosis not present

## 2020-11-15 DIAGNOSIS — G8918 Other acute postprocedural pain: Secondary | ICD-10-CM | POA: Diagnosis not present

## 2020-11-15 DIAGNOSIS — M948X6 Other specified disorders of cartilage, lower leg: Secondary | ICD-10-CM | POA: Diagnosis not present

## 2020-11-15 DIAGNOSIS — S83231A Complex tear of medial meniscus, current injury, right knee, initial encounter: Secondary | ICD-10-CM | POA: Diagnosis not present

## 2020-11-15 DIAGNOSIS — Y999 Unspecified external cause status: Secondary | ICD-10-CM | POA: Diagnosis not present

## 2020-11-15 DIAGNOSIS — X58XXXA Exposure to other specified factors, initial encounter: Secondary | ICD-10-CM | POA: Diagnosis not present

## 2020-11-15 DIAGNOSIS — S83241A Other tear of medial meniscus, current injury, right knee, initial encounter: Secondary | ICD-10-CM | POA: Diagnosis not present

## 2020-11-15 DIAGNOSIS — M2241 Chondromalacia patellae, right knee: Secondary | ICD-10-CM | POA: Diagnosis not present

## 2020-12-14 ENCOUNTER — Other Ambulatory Visit: Payer: BC Managed Care – PPO

## 2021-03-21 DIAGNOSIS — Z4789 Encounter for other orthopedic aftercare: Secondary | ICD-10-CM | POA: Diagnosis not present

## 2021-04-17 ENCOUNTER — Encounter (HOSPITAL_BASED_OUTPATIENT_CLINIC_OR_DEPARTMENT_OTHER): Payer: Self-pay | Admitting: Emergency Medicine

## 2021-04-17 ENCOUNTER — Telehealth (HOSPITAL_COMMUNITY): Payer: Self-pay

## 2021-04-17 ENCOUNTER — Emergency Department (HOSPITAL_BASED_OUTPATIENT_CLINIC_OR_DEPARTMENT_OTHER)
Admission: EM | Admit: 2021-04-17 | Discharge: 2021-04-17 | Disposition: A | Discharge status: Home / Self Care | Payer: BC Managed Care – PPO | Attending: Emergency Medicine | Admitting: Emergency Medicine

## 2021-04-17 ENCOUNTER — Other Ambulatory Visit: Payer: Self-pay

## 2021-04-17 DIAGNOSIS — I4891 Unspecified atrial fibrillation: Secondary | ICD-10-CM | POA: Insufficient documentation

## 2021-04-17 DIAGNOSIS — Z87891 Personal history of nicotine dependence: Secondary | ICD-10-CM | POA: Diagnosis not present

## 2021-04-17 DIAGNOSIS — R002 Palpitations: Secondary | ICD-10-CM | POA: Diagnosis not present

## 2021-04-17 LAB — CBC WITH DIFFERENTIAL/PLATELET
Abs Immature Granulocytes: 0.01 10*3/uL (ref 0.00–0.07)
Basophils Absolute: 0.1 10*3/uL (ref 0.0–0.1)
Basophils Relative: 1 %
Eosinophils Absolute: 0.1 10*3/uL (ref 0.0–0.5)
Eosinophils Relative: 2 %
HCT: 47.6 % (ref 39.0–52.0)
Hemoglobin: 15.8 g/dL (ref 13.0–17.0)
Immature Granulocytes: 0 %
Lymphocytes Relative: 37 %
Lymphs Abs: 2.2 10*3/uL (ref 0.7–4.0)
MCH: 30.3 pg (ref 26.0–34.0)
MCHC: 33.2 g/dL (ref 30.0–36.0)
MCV: 91.2 fL (ref 80.0–100.0)
Monocytes Absolute: 0.6 10*3/uL (ref 0.1–1.0)
Monocytes Relative: 10 %
Neutro Abs: 3.1 10*3/uL (ref 1.7–7.7)
Neutrophils Relative %: 50 %
Platelets: 293 10*3/uL (ref 150–400)
RBC: 5.22 MIL/uL (ref 4.22–5.81)
RDW: 13.1 % (ref 11.5–15.5)
WBC: 6 10*3/uL (ref 4.0–10.5)
nRBC: 0 % (ref 0.0–0.2)

## 2021-04-17 LAB — BASIC METABOLIC PANEL
Anion gap: 7 (ref 5–15)
BUN: 19 mg/dL (ref 6–20)
CO2: 27 mmol/L (ref 22–32)
Calcium: 9.1 mg/dL (ref 8.9–10.3)
Chloride: 106 mmol/L (ref 98–111)
Creatinine, Ser: 1.29 mg/dL — ABNORMAL HIGH (ref 0.61–1.24)
GFR, Estimated: >60 mL/min (ref >60)
Glucose, Bld: 107 mg/dL — ABNORMAL HIGH (ref 70–99)
Potassium: 4.5 mmol/L (ref 3.5–5.1)
Sodium: 140 mmol/L (ref 135–145)

## 2021-04-17 LAB — TROPONIN I (HIGH SENSITIVITY): Troponin I (High Sensitivity): 2 ng/L (ref <18)

## 2021-04-17 MED ORDER — METOPROLOL TARTRATE 5 MG/5ML IV SOLN
5.0000 mg | Freq: Once | INTRAVENOUS | Status: AC
  Administered 2021-04-17: 5 mg via INTRAVENOUS
  Filled 2021-04-17: qty 5

## 2021-04-17 MED ORDER — SODIUM CHLORIDE 0.9 % IV BOLUS
1000.0000 mL | Freq: Once | INTRAVENOUS | Status: AC
  Administered 2021-04-17: 1000 mL via INTRAVENOUS

## 2021-04-17 MED ORDER — METOPROLOL TARTRATE 25 MG PO TABS
25.0000 mg | ORAL_TABLET | Freq: Once | ORAL | Status: AC
  Administered 2021-04-17: 25 mg via ORAL
  Filled 2021-04-17: qty 1

## 2021-04-17 NOTE — ED Triage Notes (Signed)
Pt states he was short of breath and dizzy and had palpitations last night. States felt like he was in A.fib, hx of same. States woke up feeling better, then feeling returned while at work. Denies chest pain during triage.

## 2021-04-17 NOTE — ED Provider Notes (Signed)
[facility] EMERGENCY DEPARTMENT Provider Note   CSN: [id number] Arrival date & time: 04/17/21  4497     History Chief Complaint  Patient presents with   Shortness of Breath    Timothy Zavala is a 50 y.o. male.  Patient presents with complaint of chest palpitations lightheadedness.  Symptoms have been off and on yesterday when he found himself in atrial fibrillation again.  He has a history of paroxysmal A. fib and states has had episodes of atrial fibs off and on for the past week.  It was more prolonged yesterday but then it resolved.  He came back today while he was at work and he presents to the ER for evaluation.  He says he was on flecainide in the past but has been taken off of it for several months.  He continues taking metoprolol at home.  Denies any recent illnesses such as fevers or cough or vomiting or diarrhea denies any pain or discomfort at this time other than a sensation of palpitations.      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   KNEE PAIN, RIGHT 07/05/2010   CERVICAL RADICULOPATHY, RIGHT 07/05/2010    Past Surgical History:  Procedure Laterality Date   KNEE SURGERY Right    x 2; ganglion cyst and scope   SPINE SURGERY  2010   cervical spine   VASECTOMY  2006       Family History  Problem Relation Age of Onset   Arthritis Mother        osteo   Hypotension Mother    Arthritis Father        osteo   Stomach cancer Paternal Uncle    Atrial fibrillation Paternal Uncle    Healthy Brother    Stroke Maternal Grandfather     Social History   Tobacco Use   Smoking status: Former    Packs/day: 3.00    Years: 15.00    Pack years: 45.00    Types: Cigarettes   Smokeless tobacco:  Never  Substance Use Topics   Alcohol use: Yes    Comment: 4+ beers/day   Drug use: No    Home Medications Prior to Admission medications   Medication Sig Start Date End Date Taking? Authorizing Provider  flecainide (TAMBOCOR) 50 MG tablet Take 1 tablet (50 mg total) by mouth in the morning and at bedtime. 05/04/20 06/03/20  Wynn Banker, MD  metoprolol succinate (TOPROL-XL) 25 MG 24 hr tablet Take 1 tablet (25 mg total) by mouth daily. 08/19/20   Wynn Banker, MD    Allergies    Prednisone  Review of Systems   Review of Systems  Constitutional:  Negative for fever.  HENT:  Negative for ear pain and sore throat.   Eyes:  Negative for pain.  Respiratory:  Negative for cough.   Cardiovascular:  Positive for palpitations. Negative for chest pain.  Gastrointestinal:  Negative for abdominal pain.  Genitourinary:  Negative for flank pain.  Musculoskeletal:  Negative for back pain.  Skin:  Negative for color change and rash.  Neurological:  Negative for syncope.  All other systems reviewed and are negative.  Physical Exam Updated Vital Signs BP (!) 114/99   Pulse 63  Temp 98 F (36.7 C) (Oral)   Resp 19   Ht 6' (1.829 m)   Wt 104.3 kg   SpO2 99%   BMI 31.19 kg/m   Physical Exam Constitutional:      Appearance: He is well-developed.  HENT:     Head: Normocephalic.     Nose: Nose normal.  Eyes:     Extraocular Movements: Extraocular movements intact.  Cardiovascular:     Rate and Rhythm: Tachycardia present. Rhythm irregular.  Pulmonary:     Effort: Pulmonary effort is normal.  Skin:    Coloration: Skin is not jaundiced.  Neurological:     Mental Status: He is alert. Mental status is at baseline.    ED Results / Procedures / Treatments   Labs (all labs ordered are listed, but only abnormal results are displayed) Labs Reviewed  BASIC METABOLIC PANEL - Abnormal; Notable for the following components:      Result Value   Glucose, Bld 107 (*)     Creatinine, Ser 1.29 (*)    All other components within normal limits  CBC WITH DIFFERENTIAL/PLATELET  TROPONIN I (HIGH SENSITIVITY)    EKG EKG Interpretation  Date/Time:  Monday April 17 2021 09:13:55 EST Ventricular Rate:  118 PR Interval:  188 QRS Duration: 81 QT Interval:  307 QTC Calculation: 431 R Axis:   45 Text Interpretation: Sinus tachycardia Multiple premature complexes, vent & supraven Low voltage, precordial leads Confirmed by Thamas Jaegers ([id number]) on 04/17/2021 9:27:10 AM  Radiology No results found.  Procedures Procedures   Medications Ordered in ED Medications  metoprolol tartrate (LOPRESSOR) injection 5 mg (5 mg Intravenous Given 04/17/21 0947)  sodium chloride 0.9 % bolus 1,000 mL (0 mLs Intravenous Stopped 04/17/21 1046)  metoprolol tartrate (LOPRESSOR) tablet 25 mg (25 mg Oral Given 04/17/21 1202)    ED Course  I have reviewed the triage vital signs and the nursing notes.  Pertinent labs & imaging results that were available during my care of the patient were reviewed by me and considered in my medical decision making (see chart for details).    MDM Rules/Calculators/A&P                           EKG shows atrial fibrillation with rapid ventricular rate.  No ST elevations depressions noted no T wave inversions noted.  Patient given IV fluid and metoprolol IV, subsequently heart rate come down to about 90 to 110 bpm, remains in A. fib a flutter rhythm however.   ED OBSERVATION:  Observation care started at 9:52 AM on April 17, 2021. Indication: Therapeutic efficacy evaluation. Patient condition: Guarded Interventions: Patient given blood pressure medications, IV and oral medications.  Repeat evaluation: Done at 1:30 PM, patient improved improved heart rate improved symptoms. Patient condition: Improved:, stable, Disposition: Discharged home.   Clinically the patient states he feels better.  Will be discharged home.  His CHA2DS2-VASc  score is 0, no Eliquis started.  Advised daily aspirin though 81 mg.  Advised him to call his cardiologist later today.  Advising immediate return if he has rapid heart rate again recurrent symptoms or any additional concerns.  Conclusion of observation at 1:37 PM on 04/17/2021.    Final Clinical Impression(s) / ED Diagnoses Final diagnoses:  Atrial fibrillation with rapid ventricular response ([place])    Rx / DC Orders ED Discharge Orders          Ordered    Amb referral to AFIB  Clinic        04/17/21 1129             Cheryll Cockayne, MD 04/17/21 [phone]

## 2021-04-17 NOTE — Telephone Encounter (Signed)
Reached out to patient to schedule ED f/u. No answer left voicemail with telephone to schedule appointment.

## 2021-04-17 NOTE — ED Notes (Signed)
ED Provider at bedside. 

## 2021-04-17 NOTE — Discharge Instructions (Signed)
Call your primary care doctor or specialist as discussed in the next 2-3 days.   Return immediately back to the ER if:  Your symptoms worsen within the next 12-24 hours. You develop new symptoms such as new fevers, persistent vomiting, new pain, shortness of breath, or new weakness or numbness, or if you have any other concerns.  

## 2021-04-20 DIAGNOSIS — I480 Paroxysmal atrial fibrillation: Secondary | ICD-10-CM | POA: Diagnosis not present

## 2021-04-20 DIAGNOSIS — I10 Essential (primary) hypertension: Secondary | ICD-10-CM | POA: Diagnosis not present

## 2021-04-24 DIAGNOSIS — I480 Paroxysmal atrial fibrillation: Secondary | ICD-10-CM | POA: Diagnosis not present

## 2021-07-27 DIAGNOSIS — I10 Essential (primary) hypertension: Secondary | ICD-10-CM | POA: Diagnosis not present

## 2021-07-27 DIAGNOSIS — I480 Paroxysmal atrial fibrillation: Secondary | ICD-10-CM | POA: Diagnosis not present

## 2021-07-28 DIAGNOSIS — I480 Paroxysmal atrial fibrillation: Secondary | ICD-10-CM | POA: Diagnosis not present

## 2021-08-18 DIAGNOSIS — I10 Essential (primary) hypertension: Secondary | ICD-10-CM | POA: Diagnosis not present

## 2021-08-18 DIAGNOSIS — I480 Paroxysmal atrial fibrillation: Secondary | ICD-10-CM | POA: Diagnosis not present

## 2021-08-19 DIAGNOSIS — R002 Palpitations: Secondary | ICD-10-CM | POA: Diagnosis not present

## 2021-08-19 DIAGNOSIS — I480 Paroxysmal atrial fibrillation: Secondary | ICD-10-CM | POA: Diagnosis not present

## 2021-09-12 DIAGNOSIS — X58XXXA Exposure to other specified factors, initial encounter: Secondary | ICD-10-CM | POA: Diagnosis not present

## 2021-09-12 DIAGNOSIS — S63501A Unspecified sprain of right wrist, initial encounter: Secondary | ICD-10-CM | POA: Diagnosis not present

## 2021-09-12 DIAGNOSIS — G8911 Acute pain due to trauma: Secondary | ICD-10-CM | POA: Diagnosis not present

## 2021-09-12 DIAGNOSIS — M25532 Pain in left wrist: Secondary | ICD-10-CM | POA: Diagnosis not present

## 2021-10-06 DIAGNOSIS — I480 Paroxysmal atrial fibrillation: Secondary | ICD-10-CM | POA: Diagnosis not present

## 2021-10-06 DIAGNOSIS — I340 Nonrheumatic mitral (valve) insufficiency: Secondary | ICD-10-CM | POA: Diagnosis not present

## 2021-10-06 DIAGNOSIS — I4891 Unspecified atrial fibrillation: Secondary | ICD-10-CM | POA: Diagnosis not present

## 2021-10-06 DIAGNOSIS — I071 Rheumatic tricuspid insufficiency: Secondary | ICD-10-CM | POA: Diagnosis not present

## 2021-10-09 DIAGNOSIS — I110 Hypertensive heart disease with heart failure: Secondary | ICD-10-CM | POA: Diagnosis not present

## 2021-10-09 DIAGNOSIS — Z8673 Personal history of transient ischemic attack (TIA), and cerebral infarction without residual deficits: Secondary | ICD-10-CM | POA: Diagnosis not present

## 2021-10-09 DIAGNOSIS — Z7982 Long term (current) use of aspirin: Secondary | ICD-10-CM | POA: Diagnosis not present

## 2021-10-09 DIAGNOSIS — Z87891 Personal history of nicotine dependence: Secondary | ICD-10-CM | POA: Diagnosis not present

## 2021-10-09 DIAGNOSIS — I480 Paroxysmal atrial fibrillation: Secondary | ICD-10-CM | POA: Diagnosis not present

## 2021-10-09 DIAGNOSIS — I509 Heart failure, unspecified: Secondary | ICD-10-CM | POA: Diagnosis not present

## 2021-10-10 DIAGNOSIS — Z8673 Personal history of transient ischemic attack (TIA), and cerebral infarction without residual deficits: Secondary | ICD-10-CM | POA: Diagnosis not present

## 2021-10-10 DIAGNOSIS — I110 Hypertensive heart disease with heart failure: Secondary | ICD-10-CM | POA: Diagnosis not present

## 2021-10-10 DIAGNOSIS — M25561 Pain in right knee: Secondary | ICD-10-CM | POA: Diagnosis not present

## 2021-10-10 DIAGNOSIS — Z7982 Long term (current) use of aspirin: Secondary | ICD-10-CM | POA: Diagnosis not present

## 2021-10-10 DIAGNOSIS — Z87891 Personal history of nicotine dependence: Secondary | ICD-10-CM | POA: Diagnosis not present

## 2021-10-10 DIAGNOSIS — I480 Paroxysmal atrial fibrillation: Secondary | ICD-10-CM | POA: Diagnosis not present

## 2021-10-10 DIAGNOSIS — Z9889 Other specified postprocedural states: Secondary | ICD-10-CM | POA: Diagnosis not present

## 2021-10-10 DIAGNOSIS — I509 Heart failure, unspecified: Secondary | ICD-10-CM | POA: Diagnosis not present

## 2021-10-10 DIAGNOSIS — I10 Essential (primary) hypertension: Secondary | ICD-10-CM | POA: Diagnosis not present

## 2022-01-05 DIAGNOSIS — I480 Paroxysmal atrial fibrillation: Secondary | ICD-10-CM | POA: Diagnosis not present

## 2022-01-05 DIAGNOSIS — I10 Essential (primary) hypertension: Secondary | ICD-10-CM | POA: Diagnosis not present

## 2022-01-08 DIAGNOSIS — I480 Paroxysmal atrial fibrillation: Secondary | ICD-10-CM | POA: Diagnosis not present

## 2022-01-08 DIAGNOSIS — I498 Other specified cardiac arrhythmias: Secondary | ICD-10-CM | POA: Diagnosis not present

## 2022-04-23 DIAGNOSIS — M25522 Pain in left elbow: Secondary | ICD-10-CM | POA: Diagnosis not present

## 2022-05-14 DIAGNOSIS — M7712 Lateral epicondylitis, left elbow: Secondary | ICD-10-CM | POA: Diagnosis not present

## 2022-06-25 DIAGNOSIS — M7712 Lateral epicondylitis, left elbow: Secondary | ICD-10-CM | POA: Diagnosis not present

## 2022-07-24 DIAGNOSIS — I10 Essential (primary) hypertension: Secondary | ICD-10-CM | POA: Diagnosis not present

## 2022-07-24 DIAGNOSIS — Z8679 Personal history of other diseases of the circulatory system: Secondary | ICD-10-CM | POA: Diagnosis not present

## 2022-07-24 DIAGNOSIS — Z9889 Other specified postprocedural states: Secondary | ICD-10-CM | POA: Diagnosis not present

## 2022-07-24 DIAGNOSIS — I480 Paroxysmal atrial fibrillation: Secondary | ICD-10-CM | POA: Diagnosis not present

## 2022-07-25 DIAGNOSIS — R002 Palpitations: Secondary | ICD-10-CM | POA: Diagnosis not present

## 2022-07-25 DIAGNOSIS — I480 Paroxysmal atrial fibrillation: Secondary | ICD-10-CM | POA: Diagnosis not present

## 2022-08-01 IMAGING — DX DG KNEE COMPLETE 4+V*R*
4 series · 4 of 4 positions shown · non-contrast
Comparison: MRI report 08/04/2010.

CLINICAL DATA: Pain.  Edema.

EXAM:
RIGHT KNEE - COMPLETE 4+ VIEW

[knee ap]
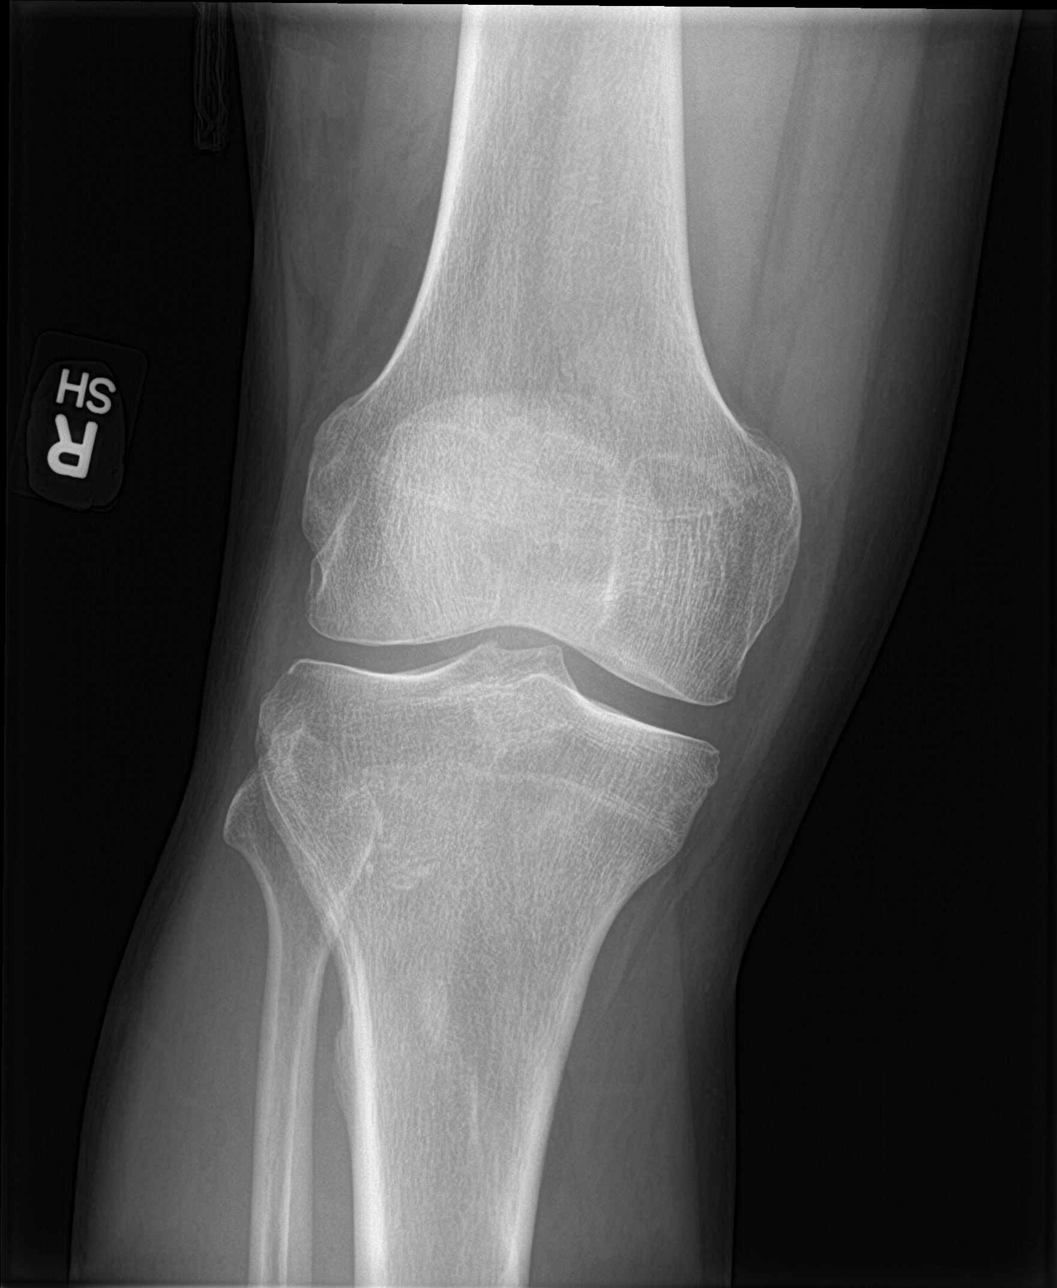

[knee lat]
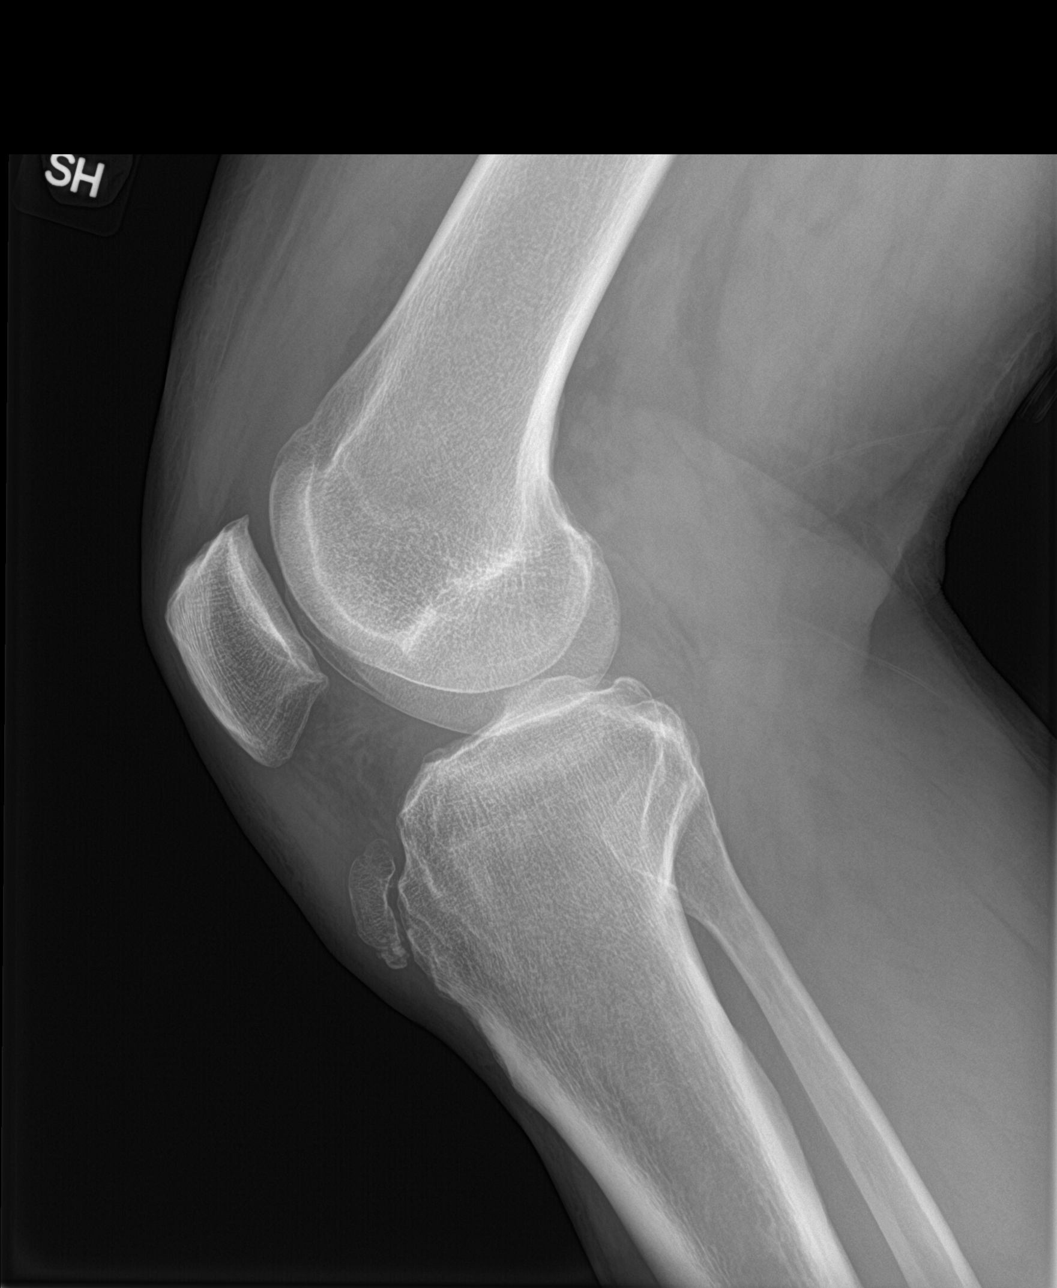

[knee obl (1 of 2)]
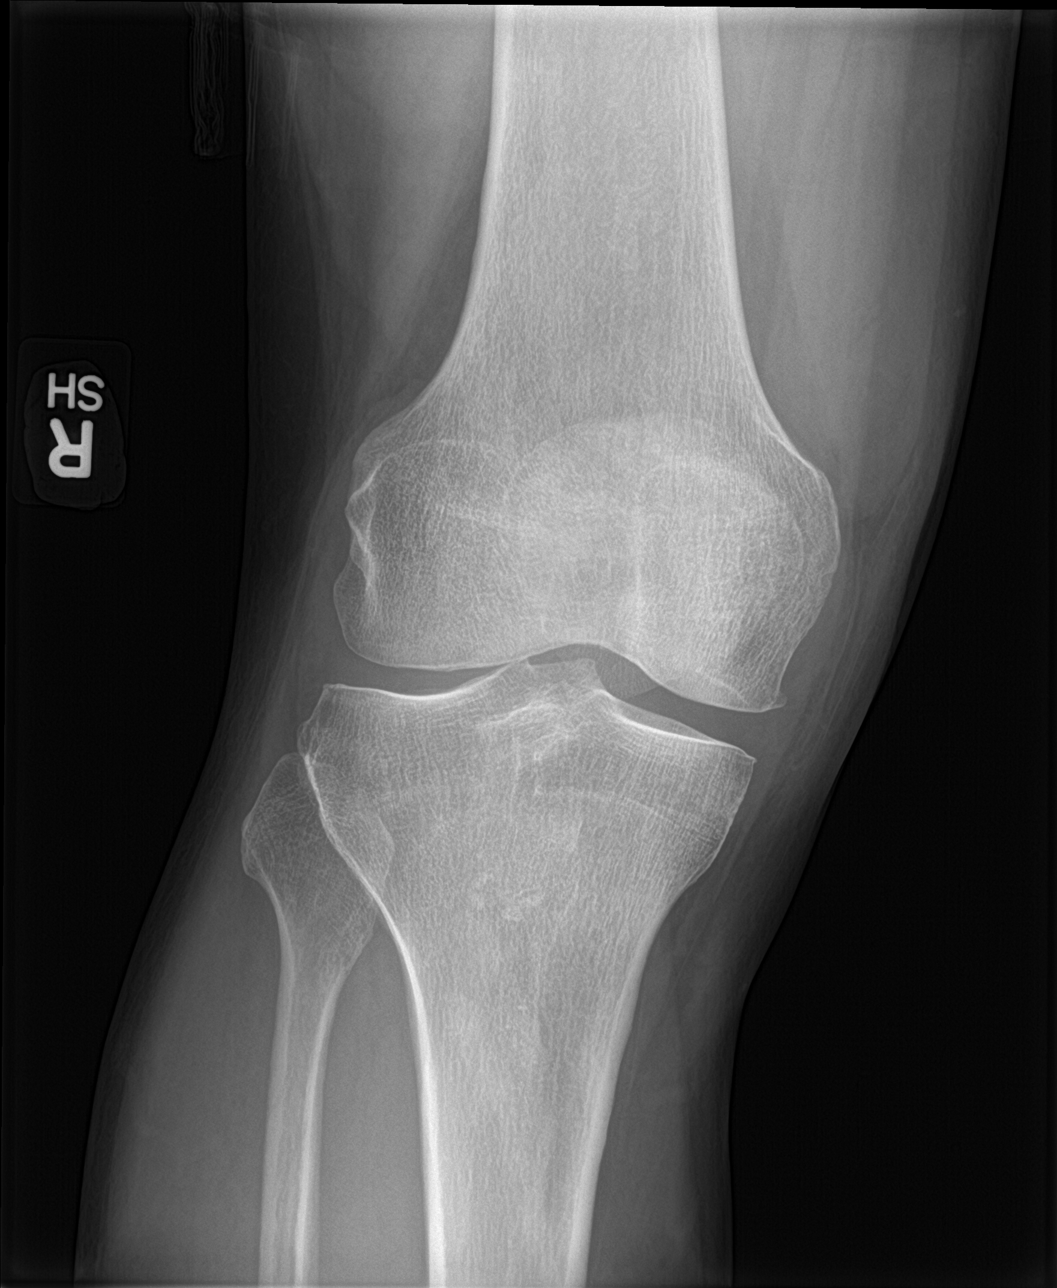

[knee obl (2 of 2)]
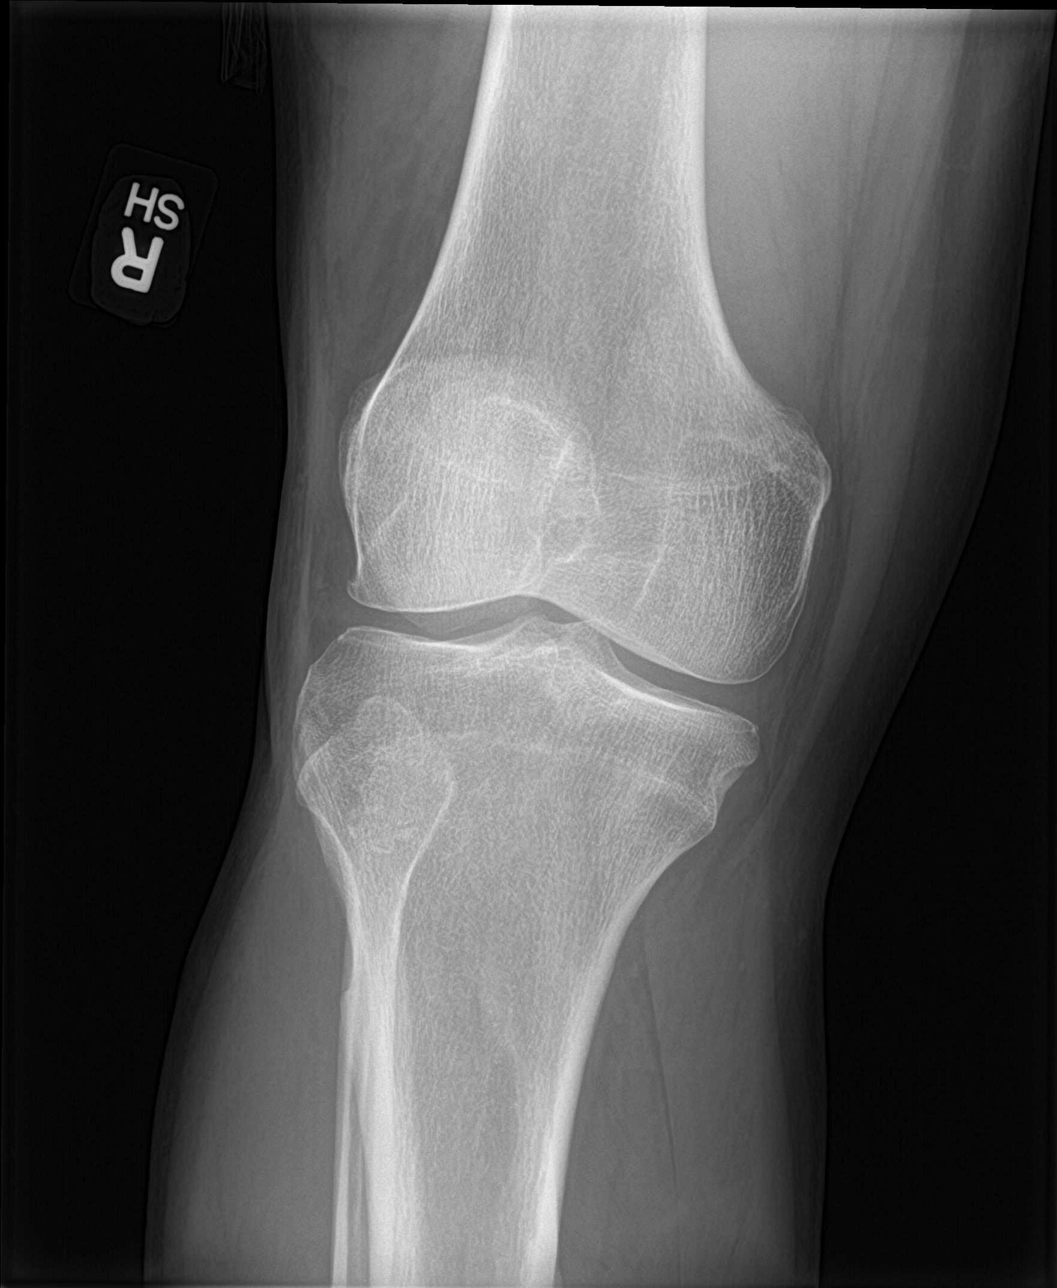

[4 of 4 positions shown; findings below may reference images not displayed]

FINDINGS: Small knee joint effusion cannot be excluded. Infrapatellar soft
tissue swelling possibly related to patellar tendinitis. Mild
tricompartment degenerative change. Non fused tibial tuberosity. No
acute bony abnormality. No evidence of fracture or dislocation.
IMPRESSION: 1. Small knee joint effusion cannot be excluded. Infrapatellar soft
tissue swelling. Patellar tendinitis cannot be excluded.
2. Mild tricompartment degenerative change. No acute bony
abnormality.
